# Patient Record
Sex: Female | Born: 1975 | Race: Black or African American | Hispanic: No | Marital: Single | Smoking: Former smoker
Health system: Southern US, Community
[De-identification: ages and names within clinical notes are randomized; demographics above are authoritative.]

## PROBLEM LIST (undated history)

## (undated) DIAGNOSIS — M7580 Other shoulder lesions, unspecified shoulder: Secondary | ICD-10-CM

## (undated) DIAGNOSIS — I10 Essential (primary) hypertension: Secondary | ICD-10-CM

## (undated) HISTORY — PX: ABDOMINAL HYSTERECTOMY: SHX81

## (undated) HISTORY — PX: TUBAL LIGATION: SHX77

---

## 2001-11-25 ENCOUNTER — Emergency Department (HOSPITAL_COMMUNITY): Admission: EM | Admit: 2001-11-25 | Discharge: 2001-11-26 | Payer: Self-pay | Admitting: Emergency Medicine

## 2001-11-26 ENCOUNTER — Encounter: Payer: Self-pay | Admitting: Emergency Medicine

## 2003-05-20 ENCOUNTER — Other Ambulatory Visit: Admission: RE | Admit: 2003-05-20 | Discharge: 2003-05-20 | Payer: Self-pay | Admitting: Obstetrics and Gynecology

## 2007-05-08 DIAGNOSIS — M7580 Other shoulder lesions, unspecified shoulder: Secondary | ICD-10-CM

## 2007-05-08 DIAGNOSIS — M778 Other enthesopathies, not elsewhere classified: Secondary | ICD-10-CM | POA: Insufficient documentation

## 2007-05-08 HISTORY — DX: Other enthesopathies, not elsewhere classified: M77.8

## 2007-05-08 HISTORY — DX: Other shoulder lesions, unspecified shoulder: M75.80

## 2008-02-21 ENCOUNTER — Emergency Department (HOSPITAL_BASED_OUTPATIENT_CLINIC_OR_DEPARTMENT_OTHER): Admission: EM | Admit: 2008-02-21 | Discharge: 2008-02-21 | Payer: Self-pay | Admitting: Emergency Medicine

## 2011-07-28 ENCOUNTER — Emergency Department (HOSPITAL_COMMUNITY): Payer: Managed Care, Other (non HMO)

## 2011-07-28 ENCOUNTER — Encounter (HOSPITAL_COMMUNITY): Payer: Self-pay | Admitting: Emergency Medicine

## 2011-07-28 ENCOUNTER — Emergency Department (HOSPITAL_COMMUNITY)
Admission: EM | Admit: 2011-07-28 | Discharge: 2011-07-28 | Disposition: A | Discharge status: Home / Self Care | Payer: Managed Care, Other (non HMO) | Attending: Emergency Medicine | Admitting: Emergency Medicine

## 2011-07-28 DIAGNOSIS — M5412 Radiculopathy, cervical region: Secondary | ICD-10-CM | POA: Insufficient documentation

## 2011-07-28 DIAGNOSIS — M79603 Pain in arm, unspecified: Secondary | ICD-10-CM

## 2011-07-28 DIAGNOSIS — Z87891 Personal history of nicotine dependence: Secondary | ICD-10-CM | POA: Insufficient documentation

## 2011-07-28 DIAGNOSIS — M25519 Pain in unspecified shoulder: Secondary | ICD-10-CM | POA: Insufficient documentation

## 2011-07-28 DIAGNOSIS — M79609 Pain in unspecified limb: Secondary | ICD-10-CM | POA: Insufficient documentation

## 2011-07-28 HISTORY — DX: Other shoulder lesions, unspecified shoulder: M75.80

## 2011-07-28 MED ORDER — NAPROXEN 500 MG PO TABS: 500.0000 mg | ORAL_TABLET | Freq: Two times a day (BID) | ORAL | Status: AC | PRN

## 2011-07-28 MED ORDER — DIAZEPAM 5 MG PO TABS: 5.0000 mg | ORAL_TABLET | Freq: Three times a day (TID) | ORAL | Status: AC | PRN

## 2011-07-28 MED ORDER — DIAZEPAM 5 MG PO TABS
5.0000 mg | ORAL_TABLET | Freq: Once | ORAL | Status: AC
  Administered 2011-07-28: 5 mg via ORAL
  Filled 2011-07-28: qty 1

## 2011-07-28 MED ORDER — DIAZEPAM 5 MG/ML IJ SOLN: 5.0000 mg | Freq: Once | INTRAMUSCULAR | Status: DC

## 2011-07-28 MED ORDER — HYDROCODONE-ACETAMINOPHEN 5-325 MG PO TABS
1.0000 | ORAL_TABLET | ORAL | Status: AC | PRN
Start: 2011-07-28 — End: 2011-08-07

## 2011-07-28 NOTE — ED Provider Notes (Signed)
Medical screening examination/treatment/procedure(s) were performed by non-physician practitioner and as supervising physician I was immediately available for consultation/collaboration.   Cyndra Numbers, MD 07/28/11 2047

## 2011-07-28 NOTE — ED Provider Notes (Signed)
History     CSN: [id number]  Arrival date & time 07/28/11  1241   First MD Initiated Contact with Patient 07/28/11 1320      Chief Complaint  Patient presents with  . Arm Pain    (Consider location/radiation/quality/duration/timing/severity/associated sxs/prior treatment) HPI Comments: Patient reports she has developed severe pain and tingling in her bilateral arms over the past two weeks.  States that it began when she started knitting - the pain starting in her hands and eventually moved up into her bilateral shoulders.  She is also having uncomfortable tingling in her bilateral hands.  Is unsure about weakness vs pain causing her to not be able to use her hands very well.  Has been using Biofreeze and ibuprofen without relief.  Pain is exacerbated by lying on either side.  Has had similar symptoms in the past only in her shoulders and was diagnosed with bilateral tendonitis which eventually resolved.  States she has also been diagnosed with carpal tunnel before for similar symptoms.  Denies fevers, neck pain, loss of control of bowel or bladder, weakness or numbness of the legs.    The history is provided by the patient.    Past Medical History  Diagnosis Date  . Tendonitis of shoulder 2009    bil shoulders     History reviewed. No pertinent past surgical history.  No family history on file.  History  Substance Use Topics  . Smoking status: Former Games developer  . Smokeless tobacco: Not on file  . Alcohol Use: 1.2 oz/week    2 Cans of beer per week    OB History    Grav Para Term Preterm Abortions TAB SAB Ect Mult Living                  Review of Systems  Constitutional: Negative for fever.  HENT: Negative for sore throat.   Respiratory: Negative for cough and shortness of breath.   Cardiovascular: Negative for chest pain.  Gastrointestinal: Negative for abdominal pain.  Neurological: Negative for syncope and weakness.  All other systems reviewed and are  negative.    Allergies  Review of patient's allergies indicates no known allergies.  Home Medications   Current Outpatient Rx  Name Route Sig Dispense Refill  . VITAMIN D PO Oral Take 1 tablet by mouth daily.    . IBUPROFEN 800 MG PO TABS Oral Take 800 mg by mouth every 6 (six) hours as needed. For pain.    Marland Kitchen BIOFREEZE EX Apply externally Apply 1 application topically as needed. To arm.    . ADULT MULTIVITAMIN W/MINERALS CH Oral Take 1 tablet by mouth 2 (two) times daily.    Marland Kitchen FISH OIL PO Oral Take 1 capsule by mouth daily.      BP 126/85  Pulse 99  Temp(Src) 98.2 F (36.8 C) (Oral)  Resp 20  SpO2 99%  LMP 07/22/2011  Physical Exam  Nursing note and vitals reviewed. Constitutional: She is oriented to person, place, and time. She appears well-developed and well-nourished.  HENT:  Head: Normocephalic and atraumatic.  Neck: Neck supple.  Cardiovascular: Normal rate and regular rhythm.   Pulmonary/Chest: Effort normal and breath sounds normal.  Musculoskeletal: Normal range of motion. She exhibits tenderness. She exhibits no edema.       Cervical back: She exhibits no tenderness.       Thoracic back: She exhibits no tenderness.       Lumbar back: She exhibits no tenderness.  Back:       Upper extremities: distal pulses intact, sensation intact.  Grip strengths normal and equal bilaterally.  Upper arm strength decreased, possibly secondary to pain.    Neurological: She is alert and oriented to person, place, and time.  Psychiatric: She has a normal mood and affect. Her behavior is normal. Judgment and thought content normal.    ED Course  Procedures (including critical care time)  Labs Reviewed - No data to display No results found.  2:17 PM I spoke with Dr Reche Dixon, radiology, regarding imaging for spinal stenosis.  Patient has implanted metal jewelry all over her body and multiple piercings, many of which she says she cannot remove.   Dr Reche Dixon states that while MRI  is the best test, CT may catch major problems.  I have discussed this with the patient who would like to proceed with the CT.    3:45 PM Patient's strength has improved, now 5/5, after pain control.     1. Arm pain   2. Cervical radiculopathy       MDM  Patient with bilateral arm pain with some tingling in her fingers.  I suspect she might have some element of cervical spinal stenosis.  However, patient is unable to undergo MRI due to extensive implanted metal body jewelry.  No weakness on exam once pain was controlled.  CT shows no major abnormalities.  Pt d/c home with pain medication and orthopedic follow up.  Patient verbalizes understanding and agrees with plan.          Dillard Cannon [place], [place] 07/28/11 1546

## 2011-07-28 NOTE — ED Notes (Signed)
Pt. Stated, I'm having arm pain both arms (pointed to both of her arms at the top of shoulder for 2 weeks.

## 2011-07-28 NOTE — Discharge Instructions (Signed)
Please follow up with your orthopedist or the orthopedist listed above.  Return immediately if you develop loss of control of bowel or bladder, or weakness in your arms or legs.  You may return to the ER at any time for worsening condition or any new symptoms that concern you.   Cervical Radiculopathy Cervical radiculopathy happens when a nerve in the neck is pinched or bruised by a slipped (herniated) disk or by arthritic changes in the bones of the cervical spine. This can occur due to an injury or as part of the normal aging process. Pressure on the cervical nerves can cause pain or numbness that runs from your neck all the way down into your arm and fingers. CAUSES  There are many possible causes, including:  Injury.   Muscle tightness in the neck from overuse.   Swollen, painful joints (arthritis).   Breakdown or degeneration in the bones and joints of the spine (spondylosis) due to aging.   Bone spurs that may develop near the cervical nerves.  SYMPTOMS  Symptoms include pain, weakness, or numbness in the affected arm and hand. Pain can be severe or irritating. Symptoms may be worse when extending or turning the neck. DIAGNOSIS  Your caregiver will ask about your symptoms and do a physical exam. He or she may test your strength and reflexes. X-rays, CT scans, and MRI scans may be needed in cases of injury or if the symptoms do not go away after a period of time. Electromyography (EMG) or nerve conduction testing may be done to study how your nerves and muscles are working. TREATMENT  Your caregiver may recommend certain exercises to help relieve your symptoms. Cervical radiculopathy can, and often does, get better with time and treatment. If your problems continue, treatment options may include:  Wearing a soft collar for short periods of time.   Physical therapy to strengthen the neck muscles.   Medicines, such as nonsteroidal anti-inflammatory drugs (NSAIDs), oral corticosteroids,  or spinal injections.   Surgery. Different types of surgery may be done depending on the cause of your problems.  HOME CARE INSTRUCTIONS   Put ice on the affected area.   Put ice in a plastic bag.   Place a towel between your skin and the bag.   Leave the ice on for 15 to 20 minutes, 3 to 4 times a day or as directed by your caregiver.   Use a flat pillow when you sleep.   Only take over-the-counter or prescription medicines for pain, discomfort, or fever as directed by your caregiver.   If physical therapy was prescribed, follow your caregiver's directions.   If a soft collar was prescribed, use it as directed.  SEEK IMMEDIATE MEDICAL CARE IF:   Your pain gets much worse and cannot be controlled with medicines.   You have weakness or numbness in your hand, arm, face, or leg.   You have a high fever or a stiff, rigid neck.   You lose bowel or bladder control (incontinence).   You have trouble with walking, balance, or speaking.  MAKE SURE YOU:   Understand these instructions.   Will watch your condition.   Will get help right away if you are not doing well or get worse.  Document Released: 01/16/2001 Document Revised: 04/12/2011 Document Reviewed: 12/05/2010 [facility] Patient Information 2012 [place], [place].  Pain of Unknown Etiology (Pain Without a Known Cause) You have come to your caregiver because of pain. Pain can occur in any part of the  body. Often there is not a definite cause. If your laboratory (blood or urine) work was normal and x-rays or other studies were normal, your caregiver may treat you without knowing the cause of the pain. An example of this is the headache. Most headaches are diagnosed by taking a history. This means your caregiver asks you questions about your headaches. Your caregiver determines a treatment based on your answers. Usually testing done for headaches is normal. Often testing is not done unless there is no response to medications.  Regardless of where your pain is located today, you can be given medications to make you comfortable. If no physical cause of pain can be found, most cases of pain will gradually leave as suddenly as they came.  If you have a painful condition and no reason can be found for the pain, It is importantthat you follow up with your caregiver. If the pain becomes worse or does not go away, it may be necessary to repeat tests and look further for a possible cause.  Only take over-the-counter or prescription medicines for pain, discomfort, or fever as directed by your caregiver.   For the protection of your privacy, test results can not be given over the phone. Make sure you receive the results of your test. Ask as to how these results are to be obtained if you have not been informed. It is your responsibility to obtain your test results.   You may continue all activities unless the activities cause more pain. When the pain lessens, it is important to gradually resume normal activities. Resume activities by beginning slowly and gradually increasing the intensity and duration of the activities or exercise. During periods of severe pain, bed-rest may be helpful. Lay or sit in any position that is comfortable.   Ice used for acute (sudden) conditions may be effective. Use a large plastic bag filled with ice and wrapped in a towel. This may provide pain relief.   See your caregiver for continued problems. They can help or refer you for exercises or physical therapy if necessary.  If you were given medications for your condition, do not drive, operate machinery or power tools, or sign legal documents for 24 hours. Do not drink alcohol, take sleeping pills, or take other medications that may interfere with treatment. See your caregiver immediately if you have pain that is becoming worse and not relieved by medications. Document Released: 01/16/2001 Document Revised: 04/12/2011 Document Reviewed:  04/23/2005 [facility] Patient Information 2012 [place], [place].

## 2013-04-08 ENCOUNTER — Other Ambulatory Visit: Payer: Self-pay | Admitting: Obstetrics and Gynecology

## 2013-04-17 ENCOUNTER — Encounter (HOSPITAL_COMMUNITY): Payer: Self-pay | Admitting: Pharmacist

## 2013-04-22 ENCOUNTER — Encounter (HOSPITAL_COMMUNITY): Payer: Self-pay

## 2013-04-22 ENCOUNTER — Encounter (HOSPITAL_COMMUNITY)
Admission: RE | Admit: 2013-04-22 | Discharge: 2013-04-22 | Disposition: A | Discharge status: Home / Self Care | Payer: Managed Care, Other (non HMO) | Source: Ambulatory Visit | Attending: Obstetrics and Gynecology | Admitting: Obstetrics and Gynecology

## 2013-04-22 LAB — BASIC METABOLIC PANEL
BUN: 7 mg/dL (ref 6–23)
CO2: 27 mEq/L (ref 19–32)
Calcium: 9.4 mg/dL (ref 8.4–10.5)
Chloride: 98 mEq/L (ref 96–112)
Creatinine, Ser: 0.94 mg/dL (ref 0.50–1.10)
GFR calc Af Amer: 87 mL/min — ABNORMAL LOW (ref >90)
GFR calc non Af Amer: 75 mL/min — ABNORMAL LOW (ref >90)
Glucose, Bld: 83 mg/dL (ref 70–99)
Potassium: 3.8 mEq/L (ref 3.5–5.1)
Sodium: 137 mEq/L (ref 135–145)

## 2013-04-22 LAB — CBC
HCT: 37.9 % (ref 36.0–46.0)
Hemoglobin: 13.5 g/dL (ref 12.0–15.0)
MCH: 33.4 pg (ref 26.0–34.0)
MCHC: 35.6 g/dL (ref 30.0–36.0)
MCV: 93.8 fL (ref 78.0–100.0)
Platelets: 275 10*3/uL (ref 150–400)
RBC: 4.04 MIL/uL (ref 3.87–5.11)
RDW: 13 % (ref 11.5–15.5)
WBC: 4 10*3/uL (ref 4.0–10.5)

## 2013-04-22 NOTE — Anesthesia Preprocedure Evaluation (Addendum)
Anesthesia Evaluation  Patient identified by MRN, date of birth, ID band Patient awake    Reviewed: Allergy & Precautions, H&P , Patient's Chart, lab work & pertinent test results  Airway Mallampati: II      Dental no notable dental hx. (+) Teeth Intact   Pulmonary neg pulmonary ROS, Current Smoker, former smoker,  breath sounds clear to auscultation  Pulmonary exam normal       Cardiovascular negative cardio ROS  Rhythm:Regular Rate:Normal     Neuro/Psych Anxiety  Neuromuscular disease    GI/Hepatic negative GI ROS, Neg liver ROS,   Endo/Other  negative endocrine ROS  Renal/GU negative Renal ROS  negative genitourinary   Musculoskeletal negative musculoskeletal ROS (+)   Abdominal (+) - obese,   Peds  Hematology negative hematology ROS (+)   Anesthesia Other Findings   Reproductive/Obstetrics Menorrhagia Fibroid uterus                         Anesthesia Physical Anesthesia Plan  ASA: II  Anesthesia Plan: General   Post-op Pain Management:    Induction: Intravenous  Airway Management Planned: Oral ETT  Additional Equipment:   Intra-op Plan:   Post-operative Plan: Extubation in OR  Informed Consent: I have reviewed the patients History and Physical, chart, labs and discussed the procedure including the risks, benefits and alternatives for the proposed anesthesia with the patient or authorized representative who has indicated his/her understanding and acceptance.   Dental advisory given  Plan Discussed with: CRNA, Anesthesiologist and Surgeon  Anesthesia Plan Comments:         Anesthesia Quick Evaluation

## 2013-04-22 NOTE — Patient Instructions (Signed)
Your procedure is scheduled on: 04/23/2013  Enter through the Main Entrance of [facility] at: 11:00AM  Pick up the phone at the desk and dial [phone].  Call this number if you have problems the morning of surgery: [phone].  Remember: Do NOT eat food: AFTER MIDNIGHT TONIGHT Do NOT drink clear liquids after: AFTER 8:30AM TOMORROW Take these medicines the morning of surgery with a SIP OF WATER: NONE  Do NOT wear jewelry (body piercing), make-up, or nail polish. Do NOT wear lotions, powders, or perfumes.  You may wear deoderant. Do NOT shave for 48 hours prior to surgery. Do NOT bring valuables to the hospital. Contacts, dentures, or bridgework may not be worn into surgery. Leave suitcase in car.  After surgery it may be brought to your room.  For patients admitted to the hospital, checkout time is 11:00 AM the day of discharge.

## 2013-04-23 ENCOUNTER — Ambulatory Visit (HOSPITAL_COMMUNITY): Payer: Managed Care, Other (non HMO) | Admitting: Anesthesiology

## 2013-04-23 ENCOUNTER — Encounter (HOSPITAL_COMMUNITY): Payer: Self-pay | Admitting: *Deleted

## 2013-04-23 ENCOUNTER — Ambulatory Visit (HOSPITAL_COMMUNITY)
Admission: RE | Admit: 2013-04-23 | Discharge: 2013-04-24 | Disposition: A | Discharge status: Home / Self Care | Payer: Managed Care, Other (non HMO) | Source: Ambulatory Visit | Attending: Obstetrics and Gynecology | Admitting: Obstetrics and Gynecology

## 2013-04-23 ENCOUNTER — Encounter (HOSPITAL_COMMUNITY): Payer: Managed Care, Other (non HMO) | Admitting: Anesthesiology

## 2013-04-23 ENCOUNTER — Encounter (HOSPITAL_COMMUNITY)
Admission: RE | Disposition: A | Discharge status: Home / Self Care | Payer: Self-pay | Source: Ambulatory Visit | Attending: Obstetrics and Gynecology

## 2013-04-23 DIAGNOSIS — D252 Subserosal leiomyoma of uterus: Secondary | ICD-10-CM | POA: Insufficient documentation

## 2013-04-23 DIAGNOSIS — N946 Dysmenorrhea, unspecified: Secondary | ICD-10-CM | POA: Insufficient documentation

## 2013-04-23 DIAGNOSIS — Z90710 Acquired absence of both cervix and uterus: Secondary | ICD-10-CM

## 2013-04-23 DIAGNOSIS — D251 Intramural leiomyoma of uterus: Secondary | ICD-10-CM | POA: Insufficient documentation

## 2013-04-23 DIAGNOSIS — N838 Other noninflammatory disorders of ovary, fallopian tube and broad ligament: Secondary | ICD-10-CM | POA: Insufficient documentation

## 2013-04-23 DIAGNOSIS — N920 Excessive and frequent menstruation with regular cycle: Principal | ICD-10-CM | POA: Insufficient documentation

## 2013-04-23 HISTORY — PX: BILATERAL SALPINGECTOMY: SHX5743

## 2013-04-23 SURGERY — ROBOTIC SINGLE SITE TOTAL HYSTERECTOMY
Anesthesia: General

## 2013-04-23 MED ORDER — DEXTROSE IN LACTATED RINGERS 5 % IV SOLN
INTRAVENOUS | Status: DC
  Administered 2013-04-23: 1000 mL via INTRAVENOUS

## 2013-04-23 MED ORDER — SODIUM CHLORIDE 0.9 % IJ SOLN
INTRAMUSCULAR | Status: AC
  Filled 2013-04-23: qty 10

## 2013-04-23 MED ORDER — SCOPOLAMINE 1 MG/3DAYS TD PT72
MEDICATED_PATCH | TRANSDERMAL | Status: AC
  Administered 2013-04-23: 1.5 mg
  Filled 2013-04-23: qty 1

## 2013-04-23 MED ORDER — HYDROMORPHONE HCL PF 1 MG/ML IJ SOLN
0.2500 mg | INTRAMUSCULAR | Status: DC | PRN
  Administered 2013-04-23 (×4): 0.5 mg via INTRAVENOUS

## 2013-04-23 MED ORDER — LIDOCAINE HCL (CARDIAC) 20 MG/ML IV SOLN
INTRAVENOUS | Status: DC | PRN
  Administered 2013-04-23: 50 mg via INTRAVENOUS

## 2013-04-23 MED ORDER — GLYCOPYRROLATE 0.2 MG/ML IJ SOLN
INTRAMUSCULAR | Status: DC | PRN
  Administered 2013-04-23: .8 mg via INTRAVENOUS

## 2013-04-23 MED ORDER — PNEUMOCOCCAL VAC POLYVALENT 25 MCG/0.5ML IJ INJ
0.5000 mL | INJECTION | INTRAMUSCULAR | Status: AC
  Administered 2013-04-24: 0.5 mL via INTRAMUSCULAR
  Filled 2013-04-23: qty 0.5

## 2013-04-23 MED ORDER — HYDROMORPHONE HCL PF 1 MG/ML IJ SOLN: 0.2000 mg | INTRAMUSCULAR | Status: DC | PRN

## 2013-04-23 MED ORDER — KETOROLAC TROMETHAMINE 30 MG/ML IJ SOLN: 30.0000 mg | Freq: Four times a day (QID) | INTRAMUSCULAR | Status: DC

## 2013-04-23 MED ORDER — LABETALOL HCL 5 MG/ML IV SOLN
5.0000 mg | Freq: Once | INTRAVENOUS | Status: DC
  Filled 2013-04-23: qty 4

## 2013-04-23 MED ORDER — ONDANSETRON HCL 4 MG PO TABS: 4.0000 mg | ORAL_TABLET | Freq: Four times a day (QID) | ORAL | Status: DC | PRN

## 2013-04-23 MED ORDER — ARTIFICIAL TEARS OP OINT
TOPICAL_OINTMENT | OPHTHALMIC | Status: DC | PRN
  Administered 2013-04-23: 1 via OPHTHALMIC

## 2013-04-23 MED ORDER — KETOROLAC TROMETHAMINE 30 MG/ML IJ SOLN
30.0000 mg | Freq: Four times a day (QID) | INTRAMUSCULAR | Status: DC
  Administered 2013-04-23: 30 mg via INTRAVENOUS
  Filled 2013-04-23: qty 1

## 2013-04-23 MED ORDER — IBUPROFEN 800 MG PO TABS
800.0000 mg | ORAL_TABLET | Freq: Three times a day (TID) | ORAL | Status: DC | PRN
  Administered 2013-04-24: 800 mg via ORAL
  Filled 2013-04-23: qty 1

## 2013-04-23 MED ORDER — MIDAZOLAM HCL 5 MG/5ML IJ SOLN
INTRAMUSCULAR | Status: DC | PRN
  Administered 2013-04-23: 2 mg via INTRAVENOUS

## 2013-04-23 MED ORDER — HYDROMORPHONE HCL PF 1 MG/ML IJ SOLN
INTRAMUSCULAR | Status: AC
  Filled 2013-04-23: qty 1

## 2013-04-23 MED ORDER — DEXAMETHASONE SODIUM PHOSPHATE 10 MG/ML IJ SOLN
INTRAMUSCULAR | Status: AC
  Filled 2013-04-23: qty 1

## 2013-04-23 MED ORDER — ONDANSETRON HCL 4 MG/2ML IJ SOLN
INTRAMUSCULAR | Status: AC
  Filled 2013-04-23: qty 2

## 2013-04-23 MED ORDER — ACETAMINOPHEN 160 MG/5ML PO SOLN
650.0000 mg | ORAL | Status: DC
  Administered 2013-04-24 (×3): 650 mg via ORAL
  Filled 2013-04-23 (×3): qty 20.3

## 2013-04-23 MED ORDER — LIDOCAINE HCL (CARDIAC) 20 MG/ML IV SOLN
INTRAVENOUS | Status: AC
  Filled 2013-04-23: qty 5

## 2013-04-23 MED ORDER — ZOLPIDEM TARTRATE 5 MG PO TABS: 5.0000 mg | ORAL_TABLET | Freq: Every evening | ORAL | Status: DC | PRN

## 2013-04-23 MED ORDER — ROCURONIUM BROMIDE 100 MG/10ML IV SOLN
INTRAVENOUS | Status: DC | PRN
  Administered 2013-04-23: 20 mg via INTRAVENOUS
  Administered 2013-04-23: 10 mg via INTRAVENOUS
  Administered 2013-04-23: 60 mg via INTRAVENOUS
  Administered 2013-04-23: 10 mg via INTRAVENOUS

## 2013-04-23 MED ORDER — PANTOPRAZOLE SODIUM 40 MG PO TBEC
40.0000 mg | DELAYED_RELEASE_TABLET | Freq: Every day | ORAL | Status: DC
  Administered 2013-04-24: 40 mg via ORAL
  Filled 2013-04-23: qty 1

## 2013-04-23 MED ORDER — ARTIFICIAL TEARS OP OINT
TOPICAL_OINTMENT | OPHTHALMIC | Status: AC
  Filled 2013-04-23: qty 3.5

## 2013-04-23 MED ORDER — KETOROLAC TROMETHAMINE 30 MG/ML IJ SOLN
INTRAMUSCULAR | Status: DC | PRN
  Administered 2013-04-23: 30 mg via INTRAMUSCULAR
  Administered 2013-04-23: 30 mg via INTRAVENOUS

## 2013-04-23 MED ORDER — LACTATED RINGERS IV SOLN
INTRAVENOUS | Status: DC
  Administered 2013-04-23 (×2): via INTRAVENOUS

## 2013-04-23 MED ORDER — FENTANYL CITRATE 0.05 MG/ML IJ SOLN
INTRAMUSCULAR | Status: AC
  Filled 2013-04-23: qty 5

## 2013-04-23 MED ORDER — HYDROMORPHONE HCL PF 1 MG/ML IJ SOLN
INTRAMUSCULAR | Status: DC | PRN
  Administered 2013-04-23: 0.5 mg via INTRAVENOUS
  Administered 2013-04-23: .5 mg via INTRAVENOUS

## 2013-04-23 MED ORDER — INFLUENZA VAC SPLIT QUAD 0.5 ML IM SUSP
0.5000 mL | INTRAMUSCULAR | Status: AC
  Administered 2013-04-24: 0.5 mL via INTRAMUSCULAR
  Filled 2013-04-23: qty 0.5

## 2013-04-23 MED ORDER — PROPOFOL 10 MG/ML IV EMUL
INTRAVENOUS | Status: AC
  Filled 2013-04-23: qty 20

## 2013-04-23 MED ORDER — MIDAZOLAM HCL 2 MG/2ML IJ SOLN
INTRAMUSCULAR | Status: AC
  Filled 2013-04-23: qty 2

## 2013-04-23 MED ORDER — LACTATED RINGERS IR SOLN
Status: DC | PRN
  Administered 2013-04-23: 3000 mL

## 2013-04-23 MED ORDER — FENTANYL CITRATE 0.05 MG/ML IJ SOLN
100.0000 ug | Freq: Once | INTRAMUSCULAR | Status: AC
  Administered 2013-04-23 (×2): 50 ug via INTRAVENOUS

## 2013-04-23 MED ORDER — ONDANSETRON HCL 4 MG/2ML IJ SOLN
INTRAMUSCULAR | Status: DC | PRN
  Administered 2013-04-23: 4 mg via INTRAVENOUS

## 2013-04-23 MED ORDER — CEFAZOLIN SODIUM-DEXTROSE 2-3 GM-% IV SOLR
2.0000 g | INTRAVENOUS | Status: AC
  Administered 2013-04-23: 2 g via INTRAVENOUS

## 2013-04-23 MED ORDER — BUPIVACAINE HCL (PF) 0.25 % IJ SOLN
INTRAMUSCULAR | Status: AC
  Filled 2013-04-23: qty 30

## 2013-04-23 MED ORDER — FENTANYL CITRATE 0.05 MG/ML IJ SOLN
INTRAMUSCULAR | Status: DC | PRN
  Administered 2013-04-23: 150 ug via INTRAVENOUS
  Administered 2013-04-23: 100 ug via INTRAVENOUS
  Administered 2013-04-23: 50 ug via INTRAVENOUS
  Administered 2013-04-23: 100 ug via INTRAVENOUS
  Administered 2013-04-23 (×2): 50 ug via INTRAVENOUS

## 2013-04-23 MED ORDER — NEOSTIGMINE METHYLSULFATE 1 MG/ML IJ SOLN
INTRAMUSCULAR | Status: DC | PRN
  Administered 2013-04-23: 4 mg via INTRAVENOUS

## 2013-04-23 MED ORDER — ROCURONIUM BROMIDE 100 MG/10ML IV SOLN
INTRAVENOUS | Status: AC
  Filled 2013-04-23: qty 1

## 2013-04-23 MED ORDER — ONDANSETRON HCL 4 MG/2ML IJ SOLN: 4.0000 mg | Freq: Four times a day (QID) | INTRAMUSCULAR | Status: DC | PRN

## 2013-04-23 MED ORDER — BUPIVACAINE HCL (PF) 0.25 % IJ SOLN
INTRAMUSCULAR | Status: DC | PRN
  Administered 2013-04-23: 2 mL

## 2013-04-23 MED ORDER — FENTANYL CITRATE 0.05 MG/ML IJ SOLN
INTRAMUSCULAR | Status: AC
  Administered 2013-04-23: 50 ug via INTRAVENOUS
  Filled 2013-04-23: qty 2

## 2013-04-23 MED ORDER — OXYCODONE-ACETAMINOPHEN 5-325 MG PO TABS
1.0000 | ORAL_TABLET | ORAL | Status: DC | PRN
  Administered 2013-04-23 – 2013-04-24 (×2): 2 via ORAL
  Filled 2013-04-23 (×2): qty 2

## 2013-04-23 MED ORDER — MENTHOL 3 MG MT LOZG: 1.0000 | LOZENGE | OROMUCOSAL | Status: DC | PRN

## 2013-04-23 MED ORDER — DEXAMETHASONE SODIUM PHOSPHATE 10 MG/ML IJ SOLN
INTRAMUSCULAR | Status: DC | PRN
  Administered 2013-04-23: 10 mg via INTRAVENOUS

## 2013-04-23 MED ORDER — PROPOFOL 10 MG/ML IV BOLUS
INTRAVENOUS | Status: DC | PRN
  Administered 2013-04-23: 200 mg via INTRAVENOUS

## 2013-04-23 SURGICAL SUPPLY — 78 items
ADH SKN CLS APL DERMABOND .7 (GAUZE/BANDAGES/DRESSINGS) ×2
BAG URINE DRAINAGE (UROLOGICAL SUPPLIES) ×3 IMPLANT
BARRIER ADHS 3X4 INTERCEED (GAUZE/BANDAGES/DRESSINGS) ×3 IMPLANT
BRR ADH 4X3 ABS CNTRL BYND (GAUZE/BANDAGES/DRESSINGS) ×2
CANNULA SEALS 8.5MM (CANNULA) ×2
CATH FOLEY 3WAY  5CC 16FR (CATHETERS) ×1
CATH FOLEY 3WAY 5CC 16FR (CATHETERS) ×2 IMPLANT
CHLORAPREP W/TINT 26ML (MISCELLANEOUS) ×3 IMPLANT
CLOTH BEACON ORANGE TIMEOUT ST (SAFETY) ×3 IMPLANT
CONT PATH 16OZ SNAP LID 3702 (MISCELLANEOUS) ×3 IMPLANT
CORD ACTIVE DISPOSABLE (ELECTRODE) ×1
CORD ELECTRO ACTIVE DISP (ELECTRODE) IMPLANT
CORDS BIPOLAR (ELECTRODE) ×1 IMPLANT
COVER MAYO STAND STRL (DRAPES) ×3 IMPLANT
COVER TABLE BACK 60X90 (DRAPES) ×6 IMPLANT
COVER TIP SHEARS 8 DVNC (MISCELLANEOUS) ×2 IMPLANT
COVER TIP SHEARS 8MM DA VINCI (MISCELLANEOUS) ×1
DECANTER SPIKE VIAL GLASS SM (MISCELLANEOUS) ×3 IMPLANT
DERMABOND ADVANCED (GAUZE/BANDAGES/DRESSINGS) ×1
DERMABOND ADVANCED .7 DNX12 (GAUZE/BANDAGES/DRESSINGS) ×2 IMPLANT
DEVICE TROCAR PUNCTURE CLOSURE (ENDOMECHANICALS) IMPLANT
DRAPE HUG U DISPOSABLE (DRAPE) ×3 IMPLANT
DRAPE LG THREE QUARTER DISP (DRAPES) ×6 IMPLANT
DRAPE WARM FLUID 44X44 (DRAPE) ×3 IMPLANT
ELECT REM PT RETURN 9FT ADLT (ELECTROSURGICAL) ×3
ELECTRODE REM PT RTRN 9FT ADLT (ELECTROSURGICAL) ×2 IMPLANT
EVACUATOR SMOKE 8.L (FILTER) ×3 IMPLANT
FILTER SMOKE EVAC LAPAROSHD (FILTER) ×1 IMPLANT
FORCEPS BI-POLAR FEN SS 5MM (FORCEP) ×1 IMPLANT
GAUZE VASELINE 3X9 (GAUZE/BANDAGES/DRESSINGS) IMPLANT
GLOVE BIOGEL PI IND STRL 7.0 (GLOVE) ×4 IMPLANT
GLOVE BIOGEL PI INDICATOR 7.0 (GLOVE) ×2
GLOVE ECLIPSE 6.5 STRL STRAW (GLOVE) ×3 IMPLANT
GOWN STRL REIN XL XLG (GOWN DISPOSABLE) ×18 IMPLANT
IRRIGATOR SUCTION SS 5MM (IRRIGATION / IRRIGATOR) ×2 IMPLANT
KIT ACCESSORY DA VINCI DISP (KITS) ×1
KIT ACCESSORY DVNC DISP (KITS) ×2 IMPLANT
LEGGING LITHOTOMY PAIR STRL (DRAPES) ×3 IMPLANT
NDL INSUFFLATION 14GA 150MM (NEEDLE) ×2 IMPLANT
NEEDLE INSUFFLATION 14GA 150MM (NEEDLE) ×3 IMPLANT
OCCLUDER COLPOPNEUMO (BALLOONS) ×2 IMPLANT
PACK LAVH (CUSTOM PROCEDURE TRAY) ×3 IMPLANT
PAD PREP 24X48 CUFFED NSTRL (MISCELLANEOUS) ×6 IMPLANT
PLUG CATH AND CAP STER (CATHETERS) ×3 IMPLANT
PORT ENDOSCOPE SS 8.5MM (MISCELLANEOUS) IMPLANT
PROTECTOR NERVE ULNAR (MISCELLANEOUS) ×6 IMPLANT
RETRACTOR WOUND ALXS 19CM XSML (INSTRUMENTS) IMPLANT
RTRCTR WOUND ALEXIS 19CM XSML (INSTRUMENTS)
SCISSORS LAP 5X35 DISP (ENDOMECHANICALS) IMPLANT
SEAL CANN 8.5 DVNC (CANNULA) IMPLANT
SET CYSTO W/LG BORE CLAMP LF (SET/KITS/TRAYS/PACK) IMPLANT
SET IRRIG TUBING LAPAROSCOPIC (IRRIGATION / IRRIGATOR) ×3 IMPLANT
SOLUTION ELECTROLUBE (MISCELLANEOUS) ×3 IMPLANT
SUT MNCRL AB 4-0 PS2 18 (SUTURE) ×1 IMPLANT
SUT MNCRL+ AB 3-0 CT1 36 (SUTURE) IMPLANT
SUT MONOCRYL AB 3-0 CT1 36IN (SUTURE) ×1
SUT PDS AB 0 CTX 36 PDP370T (SUTURE) ×1 IMPLANT
SUT VICRYL 0 UR6 27IN ABS (SUTURE) ×5 IMPLANT
SUT VLOC 180 0 6IN GS21 (SUTURE) ×2 IMPLANT
SUT VLOC 180 0 9IN  GS21 (SUTURE) ×1
SUT VLOC 180 0 9IN GS21 (SUTURE) ×2 IMPLANT
SYR 50ML LL SCALE MARK (SYRINGE) ×3 IMPLANT
SYRINGE 10CC LL (SYRINGE) ×3 IMPLANT
TIP RUMI ORANGE 6.7MMX12CM (TIP) IMPLANT
TIP UTERINE 5.1X6CM LAV DISP (MISCELLANEOUS) IMPLANT
TIP UTERINE 6.7X10CM GRN DISP (MISCELLANEOUS) IMPLANT
TIP UTERINE 6.7X6CM WHT DISP (MISCELLANEOUS) IMPLANT
TIP UTERINE 6.7X8CM BLUE DISP (MISCELLANEOUS) ×1 IMPLANT
TOWEL OR 17X24 6PK STRL BLUE (TOWEL DISPOSABLE) ×9 IMPLANT
TROCAR 12M 150ML BLUNT (TROCAR) IMPLANT
TROCAR DISP BLADELESS 8 DVNC (TROCAR) IMPLANT
TROCAR DISP BLADELESS 8MM (TROCAR)
TROCAR XCEL 12X100 BLDLESS (ENDOMECHANICALS) ×3 IMPLANT
TROCAR XCEL NON-BLD 5MMX100MML (ENDOMECHANICALS) ×3 IMPLANT
TROCAR Z-THREAD 12X150 (TROCAR) ×3 IMPLANT
TUBING FILTER THERMOFLATOR (ELECTROSURGICAL) ×3 IMPLANT
WARMER LAPAROSCOPE (MISCELLANEOUS) ×3 IMPLANT
WATER STERILE IRR 1000ML POUR (IV SOLUTION) ×9 IMPLANT

## 2013-04-23 NOTE — Brief Op Note (Signed)
04/23/2013  6:13 PM  PATIENT:  Talitha Givens  37 y.o. female  PRE-OPERATIVE DIAGNOSIS:  Menorrhagia, Uterine Fibroid, Dysmenorrhea    POST-OPERATIVE DIAGNOSIS:  Menorrhagia, Uterine Fibroid, Dysmenorrhea    PROCEDURE:  Procedure(s) with comments: ROBOTIC SINGLE SITE TOTAL HYSTERECTOMY (N/A) - 3 1/2 hrs. BILATERAL SALPINGECTOMY (Bilateral)  SURGEON:  Surgeon(s) and Role:    * Jessye Imhoff Cathie Beams, MD - Primary  PHYSICIAN ASSISTANT:   ASSISTANTS: Marlinda Mike, CNM   ANESTHESIA:   general Findings: nl appendix, nl liver edge, fibroid uterus, nl ovaries  EBL:  Total I/O In: 2000 [I.V.:2000] Out: 220 [Urine:120; Blood:100]  BLOOD ADMINISTERED:none  DRAINS: none   LOCAL MEDICATIONS USED:  MARCAINE     SPECIMEN:  Source of Specimen:  uterus w/ cervix, tubes  DISPOSITION OF SPECIMEN:  PATHOLOGY  COUNTS:  YES  TOURNIQUET:  * No tourniquets in log *  DICTATION: .Other Dictation: Dictation Number [id number]  PLAN OF CARE: Admit for overnight observation  PATIENT DISPOSITION:  PACU - hemodynamically stable.   Delay start of Pharmacological VTE agent (>24hrs) due to surgical blood loss or risk of bleeding: no

## 2013-04-23 NOTE — Preoperative (Signed)
Beta Blockers   Reason not to administer Beta Blockers:Not Applicable 

## 2013-04-23 NOTE — Anesthesia Postprocedure Evaluation (Signed)
  Anesthesia Post-op Note  Patient: Mallory Fernandez  Procedure(s) Performed: Procedure(s) with comments: ROBOTIC SINGLE SITE TOTAL HYSTERECTOMY (N/A) - 3 1/2 hrs. BILATERAL SALPINGECTOMY (Bilateral) Patient is awake and responsive. Pain and nausea are reasonably well controlled. Vital signs are stable and clinically acceptable. Oxygen saturation is clinically acceptable. There are no apparent anesthetic complications at this time. Patient is ready for discharge.

## 2013-04-23 NOTE — Transfer of Care (Signed)
Immediate Anesthesia Transfer of Care Note  Patient: Mallory Fernandez  Procedure(s) Performed: Procedure(s) with comments: ROBOTIC SINGLE SITE TOTAL HYSTERECTOMY (N/A) - 3 1/2 hrs. BILATERAL SALPINGECTOMY (Bilateral)  Patient Location: PACU  Anesthesia Type:General  Level of Consciousness: awake, alert  and oriented  Airway & Oxygen Therapy: Patient Spontanous Breathing and Patient connected to nasal cannula oxygen  Post-op Assessment: Report given to PACU RN and Post -op Vital signs reviewed and stable  Post vital signs: Reviewed and stable  Complications: No apparent anesthesia complications

## 2013-04-24 ENCOUNTER — Encounter (HOSPITAL_COMMUNITY): Payer: Self-pay | Admitting: Obstetrics and Gynecology

## 2013-04-24 LAB — CBC
HCT: 36.7 % (ref 36.0–46.0)
Hemoglobin: 12.9 g/dL (ref 12.0–15.0)
MCH: 33.9 pg (ref 26.0–34.0)
MCHC: 35.1 g/dL (ref 30.0–36.0)
MCV: 96.3 fL (ref 78.0–100.0)
Platelets: 246 10*3/uL (ref 150–400)
RBC: 3.81 MIL/uL — ABNORMAL LOW (ref 3.87–5.11)
RDW: 13.3 % (ref 11.5–15.5)
WBC: 15.4 10*3/uL — ABNORMAL HIGH (ref 4.0–10.5)

## 2013-04-24 LAB — BASIC METABOLIC PANEL
BUN: 7 mg/dL (ref 6–23)
CO2: 30 mEq/L (ref 19–32)
Calcium: 8.9 mg/dL (ref 8.4–10.5)
Chloride: 100 mEq/L (ref 96–112)
Creatinine, Ser: 0.94 mg/dL (ref 0.50–1.10)
GFR calc Af Amer: 87 mL/min — ABNORMAL LOW (ref >90)
GFR calc non Af Amer: 75 mL/min — ABNORMAL LOW (ref >90)
Glucose, Bld: 108 mg/dL — ABNORMAL HIGH (ref 70–99)
Potassium: 4.2 mEq/L (ref 3.5–5.1)
Sodium: 136 mEq/L (ref 135–145)

## 2013-04-24 MED ORDER — IBUPROFEN 800 MG PO TABS: 800.0000 mg | ORAL_TABLET | Freq: Three times a day (TID) | ORAL | Status: DC | PRN

## 2013-04-24 MED ORDER — OXYCODONE-ACETAMINOPHEN 5-325 MG PO TABS: 1.0000 | ORAL_TABLET | ORAL | Status: DC | PRN

## 2013-04-24 NOTE — Anesthesia Postprocedure Evaluation (Signed)
Anesthesia Post Note  Patient: Mallory Fernandez  Procedure(s) Performed: Procedure(s) (LRB): ROBOTIC SINGLE SITE TOTAL HYSTERECTOMY (N/A) BILATERAL SALPINGECTOMY (Bilateral)  Anesthesia type: General  Patient location: Mother/Baby  Post pain: Pain level controlled  Post assessment: Post-op Vital signs reviewed  Last Vitals:  Filed Vitals:   04/24/13 0505  BP: 120/80  Pulse: 75  Temp: 36.5 C  Resp: 18    Post vital signs: Reviewed  Level of consciousness: awake and alert   Complications: No apparent anesthesia complications

## 2013-04-24 NOTE — Progress Notes (Signed)
Subjective: Patient reports tolerating PO, + flatus and no problems voiding.    Objective: I have reviewed patient's vital signs.  vital signs, intake and output and labs. Filed Vitals:   04/24/13 0505  BP: 120/80  Pulse: 75  Temp: 97.7 F (36.5 C)  Resp: 18   I/O last 3 completed shifts: In: 5705.2 [P.O.:2160; I.V.:3545.2] Out: 895 [Urine:795; Blood:100]    Lab Results  Component Value Date   WBC 15.4* 04/24/2013   HGB 12.9 04/24/2013   HCT 36.7 04/24/2013   MCV 96.3 04/24/2013   PLT 246 04/24/2013   Lab Results  Component Value Date   CREATININE 0.94 04/24/2013    EXAM General: alert, cooperative and no distress Resp: clear to auscultation bilaterally Cardio: regular rate and rhythm, S1, S2 normal, no murmur, click, rub or gallop GI: soft, non-tender; bowel sounds normal; no masses,  no organomegaly and incision: clean, dry and intact Extremities: no edema, redness or tenderness in the calves or thighs Vaginal Bleeding: none  Assessment: s/p Procedure(s):DaVinci ROBOTIC SINGLE SITE TOTAL HYSTERECTOMY BILATERAL SALPINGECTOMY: stable, progressing well and tolerating diet  Plan: Advance diet Discontinue IV fluids Discharge home D/c instructions reviewed  LOS: 1 day    Lyrick Worland A, MD 04/24/2013 8:14 AM    04/24/2013, 8:14 AM

## 2013-04-24 NOTE — Discharge Summary (Signed)
Physician Discharge Summary  Patient ID: Mallory Fernandez MRN: [id number] DOB/AGE: 1975-11-09 37 y.o.  Admit date: 04/23/2013 Discharge date: 04/24/2013  Admission Diagnoses: menorrhagia, dysmenorrhea, uterine fibroids  Discharge Diagnoses: menorrhagia, dysmenorrhea, uterine fibroids  Active Problems:   S/P hysterectomy   Discharged Condition: stable  Hospital Course: pt was admitted to [facility]. She underwent robotic single site hysterectomy, bilateral salpingectomy. Uncomplicated postop course  Consults: None  Significant Diagnostic Studies: labs:  CBC    Component Value Date/Time   WBC 15.4* 04/24/2013 0527   RBC 3.81* 04/24/2013 0527   HGB 12.9 04/24/2013 0527   HCT 36.7 04/24/2013 0527   PLT 246 04/24/2013 0527   MCV 96.3 04/24/2013 0527   MCH 33.9 04/24/2013 0527   MCHC 35.1 04/24/2013 0527   RDW 13.3 04/24/2013 0527    BMET    Component Value Date/Time   NA 136 04/24/2013 0527   K 4.2 04/24/2013 0527   CL 100 04/24/2013 0527   CO2 30 04/24/2013 0527   GLUCOSE 108* 04/24/2013 0527   BUN 7 04/24/2013 0527   CREATININE 0.94 04/24/2013 0527   CALCIUM 8.9 04/24/2013 0527   GFRNONAA 75* 04/24/2013 0527   GFRAA 87* 04/24/2013 0527     Treatments: surgery: davinci robotic single site total hysterectomy, bilateral salpingectomy  Discharge Exam: Blood pressure 120/80, pulse 75, temperature 97.7 F (36.5 C), temperature source Oral, resp. rate 18, height 5\' 8"  (1.727 m), weight 67.586 kg (149 lb), SpO2 100.00%. General appearance: alert, cooperative and no distress Back: no tenderness to percussion or palpation, symmetric, no curvature. ROM normal. No CVA tenderness. Cardio: regular rate and rhythm, S1, S2 normal, no murmur, click, rub or gallop GI: soft, non-tender; bowel sounds normal; no masses,  no organomegaly Pelvic: deferred Extremities: no edema, redness or tenderness in the calves or thighs Incision/Wound:intact. Dressing clean/dry  Disposition:  01-Home or Self Care  Discharge Orders   Future Orders Complete By Expires   Diet - low sodium heart healthy  As directed    Discharge patient  As directed    Increase activity slowly  As directed    May walk up steps  As directed    No wound care  As directed        Medication List         BC HEADACHE POWDER PO  Take 1 packet by mouth as needed (headache).     BIOFREEZE EX  Apply 1 application topically as needed. To arm.     Fish Oil 1000 MG Caps  Take 1 capsule by mouth daily.     ibuprofen 800 MG tablet  Commonly known as:  ADVIL,MOTRIN  Take 1 tablet (800 mg total) by mouth every 8 (eight) hours as needed (mild pain).     multivitamin with minerals Tabs tablet  Take 1 tablet by mouth 2 (two) times daily. Mega Woman Multivitamin from [facility]     oxyCODONE-acetaminophen 5-325 MG per tablet  Commonly known as:  PERCOCET/ROXICET  Take 1-2 tablets by mouth every 4 (four) hours as needed for severe pain (moderate to severe pain (when tolerating fluids)).           Follow-up Information   Follow up with Serita Kyle, MD.   Specialty:  Obstetrics and Gynecology   Contact information:   [address] [place] [address] [phone]       Signed: Shawnia Vizcarrondo A 04/24/2013, 8:16 AM

## 2013-04-24 NOTE — Progress Notes (Signed)
Discharge instruction provided to patient at bedside.  Follow up appointment, medications, activity, incision care, when to call the doctor and community resources discussed.  No questions at this time.  Patient left unit in stable condition with all personal belongings and prescriptions accompanied by staff.  Osvaldo Angst, RN-----------------

## 2013-04-25 NOTE — Op Note (Signed)
[name], [name] NO.:  [id number]  MEDICAL RECORD NO.:  [id number]  LOCATION:  9310                          FACILITY:  [facility]  PHYSICIAN:  Mallory Fernandez, M.D.DATE OF BIRTH:  12-13-1975  DATE OF PROCEDURE:  04/23/2013 DATE OF DISCHARGE:  04/24/2013                              OPERATIVE REPORT   PREOPERATIVE DIAGNOSIS:  Menorrhagia, uterine fibroid, dysmenorrhea.  PROCEDURE:  Da Vinci robotic single site total hysterectomy, bilateral salpingectomy.  POSTOPERATIVE DIAGNOSIS:  Menorrhagia, uterine fibroid, dysmenorrhea.  ANESTHESIA:  General.  SURGEON:  Mallory Better, MD.  ASSISTANT:  Mallory Fernandez, CNM.  DESCRIPTION OF PROCEDURE:  Under adequate general anesthesia, the patient was placed in the dorsal lithotomy position.  She was positioned for robotic surgery.  She was sterilely prepped and draped in usual fashion.  A 3-way indwelling Foley catheter was sterilely placed. Examination under anesthesia had revealed retroverted uterus, irregular, about 8- to 10-week size.  A weighted speculum was placed in the vagina. Sims retractor was placed anteriorly.  The 0 Vicryl figure-of-eight sutures were placed on the anterior and posterior lip of the cervix. The uterus sounded to 8 cm.  The cervix was easily dilated and a medium size RUMI cup with #8 uterine manipulator was introduced into the uterine cavity without incident.  The retractors were then removed and attention was then turned to the abdomen.  A transverse incision was made in the umbilical region, and the fascia was subsequently identified.  The fascia was then opened vertically.  Stay sutures were placed superiorly and inferiorly.  The GelPort was inserted without incident.  The camera port was then inserted and robotic camera was inserted and the pelvis inspected.  Normal liver edge was noted.  The uterus was irregular with pedunculated fibroid.  The ovaries were normal.  Tubes were  normal.  The cul-de-sac had no evidence of endometriosis.  The patient was deemed candidate for the single site approach.  At that point, the robot was docked to the camera.  The 250 robotic port was inserted in the usual fashion with the robot being docked with each arm, arm #1 and arm #2.  Once this was done, the permanent cautery hook was placed as well as the fenestrated bipolar cautery placed.  Once these were viewed, I then went to the surgical console.  At the surgical console, the pelvis was further inspected. The procedure was done on the right side with the retroperitoneal space opened on the right.  The fallopian tube was grasped.  The underlying mesosalpinx was cauterized and cut.  The retroperitoneal incision was then carried down further.  The round ligament was clamped, cauterized, and cut.  The anterior cul-de-sac was opened.  The vesicouterine peritoneum was opened transversely and the bladder displaced inferiorly. The posterior peritoneum was then opened transversely and carried down posteriorly as well.  The uterine vessels were then subsequently identified, serially clamped, cauterized, and then cut.  Attention was then turned to the contralateral side.  The ureter was identified deep in the pelvis.  The retroperitoneal space was opened on the left.  The fallopian tube was also removed, serially clamped, cauterized, and cut. The round ligament was also clamped, cauterized, and cut.  The  remaining vesicouterine peritoneum was then opened.  The bladder was further dissected off the RUMI cup and displaced inferiorly.  The uterine vessels were then serially clamped, cauterized, and cut on the left side.  Once this was done, the vaginal insufflator was placed and the KOH ring was used to identify the upper part of the vagina. Cervicovaginal junction was identified at the upper part of the KOH ring and was opened circumferentially.  The uterus was then separated from its  vaginal attachment and removed through the vagina.  The fallopian tubes which were separated at the time of the surgery was then removed through the vagina as well.  Bleeding on the vaginal cuff was cauterized.  The bladder flap was further developed and displaced inferiorly.  The permanent hook was then replaced with a wristed needle driver and 0 V-Loc suture was inserted.  The vaginal cuff was then closed with 0 V-Loc x2.  The sutures were then removed with needle.  The abdomen was then irrigated and suctioned.  Good hemostasis noted.  Both ovaries were noted to be otherwise unremarkable.  At that point, the instruments were then removed.  The robot undocked.  I then went back to the patient's bedside and sterilely the scope was then inserted and the pelvis once again inspected.  The abdomen was then thoroughly deflated. The pelvis was inspected.  Good hemostasis noted.  At that point, the GelPort was also removed.  The stay sutures were used to bring up the fascia.  The fascia was then closed with 0 PDS suture.  The stay sutures were then tied in the midline.  The umbilical stalk which had been transected at the time of the surgery was then repositioned on the fascia, and the skin was then approximated using 4-0 Monocryl sutures subcuticularly.  A pressure bandage was then placed.  Digital exam of the vaginal cuff showed well approximation.  SPECIMEN:  Uterus with cervix, tubes sent to Pathology.  ESTIMATED BLOOD LOSS:  100 mL.  INTRAOPERATIVE FLUID:  2 L.  URINE OUTPUT:  120 mL concentrated urine.  Sponge and instrument counts x2 was correct.  COMPLICATIONS:  None.  The patient tolerated the procedure well, was transferred to recovery in stable condition.     Mallory Fernandez, M.D.     [place]/[facility]  D:  04/23/2013  T:  04/24/2013  Job:  [id number]

## 2017-03-28 ENCOUNTER — Other Ambulatory Visit: Payer: Self-pay

## 2017-03-28 ENCOUNTER — Emergency Department (HOSPITAL_BASED_OUTPATIENT_CLINIC_OR_DEPARTMENT_OTHER): Payer: 59

## 2017-03-28 ENCOUNTER — Inpatient Hospital Stay (HOSPITAL_BASED_OUTPATIENT_CLINIC_OR_DEPARTMENT_OTHER)
Admission: EM | Admit: 2017-03-28 | Discharge: 2017-04-01 | DRG: 439 | Disposition: A | Discharge status: Home / Self Care | Payer: 59 | Attending: Internal Medicine | Admitting: Internal Medicine

## 2017-03-28 ENCOUNTER — Encounter (HOSPITAL_BASED_OUTPATIENT_CLINIC_OR_DEPARTMENT_OTHER): Payer: Self-pay | Admitting: *Deleted

## 2017-03-28 ENCOUNTER — Observation Stay (HOSPITAL_COMMUNITY): Payer: 59

## 2017-03-28 DIAGNOSIS — K852 Alcohol induced acute pancreatitis without necrosis or infection: Secondary | ICD-10-CM

## 2017-03-28 DIAGNOSIS — E876 Hypokalemia: Secondary | ICD-10-CM | POA: Diagnosis present

## 2017-03-28 DIAGNOSIS — Z87891 Personal history of nicotine dependence: Secondary | ICD-10-CM

## 2017-03-28 DIAGNOSIS — Z7289 Other problems related to lifestyle: Secondary | ICD-10-CM

## 2017-03-28 DIAGNOSIS — R188 Other ascites: Secondary | ICD-10-CM | POA: Diagnosis present

## 2017-03-28 DIAGNOSIS — K859 Acute pancreatitis without necrosis or infection, unspecified: Principal | ICD-10-CM | POA: Diagnosis present

## 2017-03-28 DIAGNOSIS — R945 Abnormal results of liver function studies: Secondary | ICD-10-CM | POA: Diagnosis not present

## 2017-03-28 DIAGNOSIS — Z90710 Acquired absence of both cervix and uterus: Secondary | ICD-10-CM

## 2017-03-28 DIAGNOSIS — F1010 Alcohol abuse, uncomplicated: Secondary | ICD-10-CM | POA: Diagnosis present

## 2017-03-28 DIAGNOSIS — Z789 Other specified health status: Secondary | ICD-10-CM

## 2017-03-28 DIAGNOSIS — Z79899 Other long term (current) drug therapy: Secondary | ICD-10-CM

## 2017-03-28 DIAGNOSIS — D539 Nutritional anemia, unspecified: Secondary | ICD-10-CM | POA: Diagnosis present

## 2017-03-28 DIAGNOSIS — I10 Essential (primary) hypertension: Secondary | ICD-10-CM | POA: Diagnosis present

## 2017-03-28 HISTORY — DX: Essential (primary) hypertension: I10

## 2017-03-28 HISTORY — DX: Alcohol induced acute pancreatitis without necrosis or infection: K85.20

## 2017-03-28 LAB — COMPREHENSIVE METABOLIC PANEL
ALT: 90 U/L — ABNORMAL HIGH (ref 14–54)
AST: 135 U/L — ABNORMAL HIGH (ref 15–41)
Albumin: 4.3 g/dL (ref 3.5–5.0)
Alkaline Phosphatase: 64 U/L (ref 38–126)
Anion gap: 13 (ref 5–15)
BUN: 7 mg/dL (ref 6–20)
CO2: 29 mmol/L (ref 22–32)
Calcium: 8.9 mg/dL (ref 8.9–10.3)
Chloride: 90 mmol/L — ABNORMAL LOW (ref 101–111)
Creatinine, Ser: 0.83 mg/dL (ref 0.44–1.00)
GFR calc Af Amer: >60 mL/min (ref >60)
GFR calc non Af Amer: >60 mL/min (ref >60)
Glucose, Bld: 142 mg/dL — ABNORMAL HIGH (ref 65–99)
Potassium: 3 mmol/L — ABNORMAL LOW (ref 3.5–5.1)
Sodium: 132 mmol/L — ABNORMAL LOW (ref 135–145)
Total Bilirubin: 1.2 mg/dL (ref 0.3–1.2)
Total Protein: 7.3 g/dL (ref 6.5–8.1)

## 2017-03-28 LAB — LIPASE, BLOOD: Lipase: 1243 U/L — ABNORMAL HIGH (ref 11–51)

## 2017-03-28 LAB — URINALYSIS, ROUTINE W REFLEX MICROSCOPIC
Glucose, UA: NEGATIVE mg/dL
Hgb urine dipstick: NEGATIVE
Ketones, ur: 15 mg/dL — AB
Leukocytes, UA: NEGATIVE
Nitrite: NEGATIVE
Protein, ur: NEGATIVE mg/dL
Specific Gravity, Urine: 1.01 (ref 1.005–1.030)
pH: 8 (ref 5.0–8.0)

## 2017-03-28 LAB — CBC
HCT: 31 % — ABNORMAL LOW (ref 36.0–46.0)
Hemoglobin: 11.3 g/dL — ABNORMAL LOW (ref 12.0–15.0)
MCH: 37 pg — ABNORMAL HIGH (ref 26.0–34.0)
MCHC: 36.5 g/dL — ABNORMAL HIGH (ref 30.0–36.0)
MCV: 101.6 fL — ABNORMAL HIGH (ref 78.0–100.0)
Platelets: 246 10*3/uL (ref 150–400)
RBC: 3.05 MIL/uL — ABNORMAL LOW (ref 3.87–5.11)
RDW: 11.6 % (ref 11.5–15.5)
WBC: 5.4 10*3/uL (ref 4.0–10.5)

## 2017-03-28 LAB — LIPID PANEL
Cholesterol: 138 mg/dL (ref 0–200)
HDL: 73 mg/dL (ref >40)
LDL Cholesterol: 52 mg/dL (ref 0–99)
Total CHOL/HDL Ratio: 1.9 RATIO
Triglycerides: 65 mg/dL (ref <150)
VLDL: 13 mg/dL (ref 0–40)

## 2017-03-28 LAB — MAGNESIUM: Magnesium: 1.9 mg/dL (ref 1.7–2.4)

## 2017-03-28 LAB — PREGNANCY, URINE: Preg Test, Ur: NEGATIVE

## 2017-03-28 LAB — HEMOGLOBIN A1C
Hgb A1c MFr Bld: 4.9 % (ref 4.8–5.6)
Mean Plasma Glucose: 93.93 mg/dL

## 2017-03-28 MED ORDER — SODIUM CHLORIDE 0.9 % IV BOLUS (SEPSIS)
1000.0000 mL | Freq: Once | INTRAVENOUS | Status: AC
  Administered 2017-03-28: 1000 mL via INTRAVENOUS

## 2017-03-28 MED ORDER — HYDROMORPHONE HCL 1 MG/ML IJ SOLN
1.0000 mg | INTRAMUSCULAR | Status: DC | PRN
  Administered 2017-03-28 – 2017-03-29 (×8): 1 mg via INTRAVENOUS
  Filled 2017-03-28 (×8): qty 1

## 2017-03-28 MED ORDER — LORAZEPAM 1 MG PO TABS: 1.0000 mg | ORAL_TABLET | Freq: Four times a day (QID) | ORAL | Status: AC | PRN

## 2017-03-28 MED ORDER — POTASSIUM CHLORIDE IN NACL 20-0.9 MEQ/L-% IV SOLN
INTRAVENOUS | Status: DC
  Administered 2017-03-28 – 2017-03-29 (×3): via INTRAVENOUS
  Filled 2017-03-28 (×4): qty 1000

## 2017-03-28 MED ORDER — VITAMIN B-1 100 MG PO TABS
100.0000 mg | ORAL_TABLET | Freq: Every day | ORAL | Status: DC
  Administered 2017-03-29 – 2017-04-01 (×4): 100 mg via ORAL
  Filled 2017-03-28 (×4): qty 1

## 2017-03-28 MED ORDER — LORAZEPAM 2 MG/ML IJ SOLN
1.0000 mg | Freq: Four times a day (QID) | INTRAMUSCULAR | Status: AC | PRN
Start: 2017-03-28 — End: 2017-03-31

## 2017-03-28 MED ORDER — HYDROMORPHONE HCL 1 MG/ML IJ SOLN
1.0000 mg | INTRAMUSCULAR | Status: DC | PRN
  Administered 2017-03-28: 1 mg via INTRAVENOUS
  Filled 2017-03-28: qty 1

## 2017-03-28 MED ORDER — ONDANSETRON HCL 4 MG/2ML IJ SOLN
4.0000 mg | Freq: Once | INTRAMUSCULAR | Status: AC | PRN
  Administered 2017-03-28: 4 mg via INTRAVENOUS
  Filled 2017-03-28: qty 2

## 2017-03-28 MED ORDER — SODIUM CHLORIDE 0.9 % IV SOLN
Freq: Once | INTRAVENOUS | Status: AC
  Administered 2017-03-28: 1000 mL via INTRAVENOUS

## 2017-03-28 MED ORDER — THIAMINE HCL 100 MG/ML IJ SOLN
100.0000 mg | Freq: Every day | INTRAMUSCULAR | Status: DC
  Administered 2017-03-28: 100 mg via INTRAVENOUS
  Filled 2017-03-28: qty 2

## 2017-03-28 MED ORDER — MORPHINE SULFATE (PF) 4 MG/ML IV SOLN
4.0000 mg | Freq: Once | INTRAVENOUS | Status: AC
  Administered 2017-03-28: 4 mg via INTRAVENOUS
  Filled 2017-03-28: qty 1

## 2017-03-28 MED ORDER — ENOXAPARIN SODIUM 40 MG/0.4ML ~~LOC~~ SOLN
40.0000 mg | SUBCUTANEOUS | Status: DC
  Administered 2017-03-28 – 2017-04-01 (×5): 40 mg via SUBCUTANEOUS
  Filled 2017-03-28 (×5): qty 0.4

## 2017-03-28 MED ORDER — ONDANSETRON HCL 4 MG/2ML IJ SOLN
4.0000 mg | Freq: Four times a day (QID) | INTRAMUSCULAR | Status: DC | PRN
  Administered 2017-03-28: 4 mg via INTRAVENOUS
  Filled 2017-03-28: qty 2

## 2017-03-28 MED ORDER — HYDROMORPHONE HCL 1 MG/ML IJ SOLN
1.0000 mg | Freq: Once | INTRAMUSCULAR | Status: AC
  Administered 2017-03-28: 1 mg via INTRAVENOUS
  Filled 2017-03-28: qty 1

## 2017-03-28 MED ORDER — BOOST / RESOURCE BREEZE PO LIQD
1.0000 | Freq: Three times a day (TID) | ORAL | Status: DC
  Administered 2017-03-28: 1 via ORAL

## 2017-03-28 MED ORDER — ADULT MULTIVITAMIN W/MINERALS CH
1.0000 | ORAL_TABLET | Freq: Every day | ORAL | Status: DC
  Administered 2017-03-28 – 2017-04-01 (×4): 1 via ORAL
  Filled 2017-03-28 (×4): qty 1

## 2017-03-28 MED ORDER — FOLIC ACID 1 MG PO TABS
1.0000 mg | ORAL_TABLET | Freq: Every day | ORAL | Status: DC
  Administered 2017-03-28 – 2017-04-01 (×4): 1 mg via ORAL
  Filled 2017-03-28 (×6): qty 1

## 2017-03-28 MED ORDER — IOPAMIDOL (ISOVUE-300) INJECTION 61%
100.0000 mL | Freq: Once | INTRAVENOUS | Status: AC | PRN
  Administered 2017-03-28: 100 mL via INTRAVENOUS

## 2017-03-28 NOTE — H&P (Signed)
History and Physical    Mallory Fernandez:[id number] DOB: [birth year] DOA: 03/28/2017  Referring MD/NP/PA: ward PCP: Lucianne Lei, MD Outpatient Specialists:  Patient coming from: [facility]-- home  Chief Complaint: abdominal pain  HPI: Mallory Fernandez is a 41 y.o. female with medical history significant of HTN.  She presents with a  day of abdominal pain that radiated to her back.  She has had vomiting but no diarrhea.  Few days of chills but no dysuria.  She has no history of prior pancreatitis but does drink 3-4 beers 5 out of 7 days of the week.  She did drink more heavily than usual as her cousin was in from out of town.  She has also been on BP medications including HCTZ for the last 2 years.  In the ER, lipase was found to be elevated >1000, a low K, and a  CT scan of abdomen was done that showed: Diffuse free fluid throughout the abdomen and pelvis with edema and infiltration in the mesenteric and peripancreatic fat. Differential diagnosis includes edema from pancreatitis, peritoneal carcinomatosis/metastasis, or ascites.   Review of Systems: all systems reviewed, negative unless stated above in HPI   Past Medical History:  Diagnosis Date  . Hypertension   . Tendonitis of shoulder 2009   bil shoulders     Past Surgical History:  Procedure Laterality Date  . ABDOMINAL HYSTERECTOMY    . BILATERAL SALPINGECTOMY Bilateral 04/23/2013   Procedure: BILATERAL SALPINGECTOMY;  Surgeon: Marvene Staff, MD;  Location: [facility];  Service: Gynecology;  Laterality: Bilateral;  . TUBAL LIGATION       reports that she has quit smoking. she has never used smokeless tobacco. She reports that she drinks about 1.2 oz of alcohol per week. She reports that she does not use drugs.  No Known Allergies  History reviewed. No pertinent family history.   Prior to Admission medications   Medication Sig Start Date End Date Taking? Authorizing Provider  Olmesartan-Amlodipine-HCTZ  (TRIBENZOR) 20-5-12.5 MG TABS Take by mouth.   Yes [provider]  Multiple Vitamin (MULITIVITAMIN WITH MINERALS) TABS Take 1 tablet by mouth 2 (two) times daily. Mega Woman Multivitamin from [facility]    [provider]  Omega-3 Fatty Acids (FISH OIL) 1000 MG CAPS Take 1 capsule by mouth daily.    [provider]    Physical Exam: Vitals:   03/28/17 0159 03/28/17 0200 03/28/17 0532 03/28/17 0804  BP: (!) 122/103  119/88 121/83  Pulse: (!) 110  81 65  Resp: 18  16 16   Temp: 98.3 F (36.8 C)     TempSrc: Oral     SpO2: 100%  96% 98%  Weight:  63.5 kg (140 lb)    Height:  5\' 8"  (1.727 m)        Constitutional: NAD, calm, comfortable Vitals:   03/28/17 0159 03/28/17 0200 03/28/17 0532 03/28/17 0804  BP: (!) 122/103  119/88 121/83  Pulse: (!) 110  81 65  Resp: 18  16 16   Temp: 98.3 F (36.8 C)     TempSrc: Oral     SpO2: 100%  96% 98%  Weight:  63.5 kg (140 lb)    Height:  5\' 8"  (1.727 m)     Eyes: PERRL, lids and conjunctivae normal ENMT: Mucous membranes are moist. Posterior pharynx clear of any exudate or lesions.Normal dentition.  Neck: normal, supple, no masses, no thyromegaly Respiratory: clear to auscultation bilaterally, no wheezing, no crackles. Normal respiratory effort. No accessory  muscle use.  Cardiovascular: Regular rate and rhythm, no murmurs / rubs / gallops. No extremity edema. 2+ pedal pulses. No carotid bruits.  Abdomen: epigastric tenderness, no masses palpated. No hepatosplenomegaly. Bowel sounds positive.  Musculoskeletal: no clubbing / cyanosis. No joint deformity upper and lower extremities. Good ROM, no contractures. Normal muscle tone.  Skin: no rashes, lesions, ulcers. No induration Neurologic: CN 2-12 grossly intact. Sensation intact, DTR normal. Strength 5/5 in all 4.  Psychiatric: Normal judgment and insight. Alert and oriented x 3. Normal mood.    Labs on Admission: I have personally reviewed following labs and imaging  studies  CBC: Recent Labs  Lab 03/28/17 0240  WBC 5.4  HGB 11.3*  HCT 31.0*  MCV 101.6*  PLT 660   Basic Metabolic Panel: Recent Labs  Lab 03/28/17 0240  NA 132*  K 3.0*  CL 90*  CO2 29  GLUCOSE 142*  BUN 7  CREATININE 0.83  CALCIUM 8.9   GFR: Estimated Creatinine Clearance: 87.6 mL/min (by C-G formula based on SCr of 0.83 mg/dL). Liver Function Tests: Recent Labs  Lab 03/28/17 0240  AST 135*  ALT 90*  ALKPHOS 64  BILITOT 1.2  PROT 7.3  ALBUMIN 4.3   Recent Labs  Lab 03/28/17 0240  LIPASE 1,243*   No results for input(s): AMMONIA in the last 168 hours. Coagulation Profile: No results for input(s): INR, PROTIME in the last 168 hours. Cardiac Enzymes: No results for input(s): CKTOTAL, CKMB, CKMBINDEX, TROPONINI in the last 168 hours. BNP (last 3 results) No results for input(s): PROBNP in the last 8760 hours. HbA1C: No results for input(s): HGBA1C in the last 72 hours. CBG: No results for input(s): GLUCAP in the last 168 hours. Lipid Profile: No results for input(s): CHOL, HDL, LDLCALC, TRIG, CHOLHDL, LDLDIRECT in the last 72 hours. Thyroid Function Tests: No results for input(s): TSH, T4TOTAL, FREET4, T3FREE, THYROIDAB in the last 72 hours. Anemia Panel: No results for input(s): VITAMINB12, FOLATE, FERRITIN, TIBC, IRON, RETICCTPCT in the last 72 hours. Urine analysis:    Component Value Date/Time   COLORURINE YELLOW 03/28/2017 0200   APPEARANCEUR CLEAR 03/28/2017 0200   LABSPEC 1.010 03/28/2017 0200   PHURINE 8.0 03/28/2017 0200   GLUCOSEU NEGATIVE 03/28/2017 0200   HGBUR NEGATIVE 03/28/2017 0200   BILIRUBINUR SMALL (A) 03/28/2017 0200   KETONESUR 15 (A) 03/28/2017 0200   PROTEINUR NEGATIVE 03/28/2017 0200   NITRITE NEGATIVE 03/28/2017 0200   LEUKOCYTESUR NEGATIVE 03/28/2017 0200   Sepsis Labs: Invalid input(s): PROCALCITONIN, LACTICIDVEN No results found for this or any previous visit (from the past 240 hour(s)).   Radiological Exams on  Admission: Ct Abdomen Pelvis W Contrast  Result Date: 03/28/2017 CLINICAL DATA:  Left upper abdominal pain since yesterday. Nausea and vomiting. EXAM: CT ABDOMEN AND PELVIS WITH CONTRAST TECHNIQUE: Multidetector CT imaging of the abdomen and pelvis was performed using the standard protocol following bolus administration of intravenous contrast. CONTRAST:  186mL ISOVUE-300 IOPAMIDOL (ISOVUE-300) INJECTION 61% COMPARISON:  None. FINDINGS: Lower chest: Lung bases are clear. Hepatobiliary: Diffuse fatty infiltration of the liver. Focal areas of relative decreased density in the central liver and along the falciform ligament likely representing asymmetrical increased fat. No definite focal liver lesions. Gallbladder and bile ducts are unremarkable. Pancreas: Homogeneous appearance of the pancreatic parenchyma. No focal pancreatic mass is identified. No pancreatic ductal dilatation. Spleen: Normal in size without focal abnormality. Adrenals/Urinary Tract: Adrenal glands are unremarkable. Kidneys are normal, without renal calculi, focal lesion, or hydronephrosis. Bladder is unremarkable. Stomach/Bowel:  Stomach is within normal limits. Appendix is not identified. No evidence of bowel wall thickening, distention, or inflammatory changes. Vascular/Lymphatic: Aortic atherosclerosis. No enlarged abdominal or pelvic lymph nodes. Reproductive: Uterus is surgically absent. No abnormal adnexal masses. Other: There is free fluid diffusely throughout the abdomen and pelvis with edema and infiltration in the mesenteric and peripancreatic fat. Mesenteric fat has a somewhat nodular appearance. Differential diagnosis would include edema from pancreatitis, peritoneal carcinomatosis/metastasis, or mild ascites with proteinaceous fluid. Laboratory correlation recommended. Musculoskeletal: No acute or significant osseous findings. IMPRESSION: 1. Diffuse free fluid throughout the abdomen and pelvis with edema and infiltration in the  mesenteric and peripancreatic fat. Differential diagnosis includes edema from pancreatitis, peritoneal carcinomatosis/metastasis, or ascites. Laboratory correlation recommended. 2. Diffuse fatty infiltration of the liver. 3. Aortic atherosclerosis Electronically Signed   By: Lucienne Capers M.D.   On: 03/28/2017 04:58      Assessment/Plan Active Problems:   Pancreatitis   Hypokalemia   Alcohol use   Free fluid in abdomen due to ?acute pancreatitis - see plan below  Abdominal pain due to Acute pancreatitis -cause: medication vs alcohol use -RUQ U/S to r/o GB issues -FLP/HgbA1c -NPO except ice chips, advance as tolerated to clears -IVF -IV Pain medications  Elevated LFTs -? Alcohol related -recheck in AM -RUQ U/S pending  Alcohol use -CIWA protocol  Hypokalemia -replace in IVF -check Mg -recheck in AM    DVT prophylaxis: lovenox Code Status: full Family Communication: bedside Disposition Plan: home once tolerating PO Consults called:  Admission status: med surge obs   Geradine Girt DO [facility] Pager [phone]  If 7PM-7AM, please contact night-coverage [email or web address] Password [facility]  03/28/2017, 9:04 AM

## 2017-03-28 NOTE — Progress Notes (Signed)
Spoke to Dr. Leonides Schanz 41 year old female with a history of alcohol abuse came with abdominal pain.  Lipase elevated 1243, CT abdomen and pelvis shows mesenteric edema consistent with ascites, or edema from pancreatitis.  Patient will need IV hydration with normal saline.  CIWA protocol.

## 2017-03-28 NOTE — ED Notes (Signed)
Generalized abd pain worse on left onset 2000 last pm w n/v,  Denies ua problems

## 2017-03-28 NOTE — ED Provider Notes (Signed)
TIME SEEN: 3:08 AM  CHIEF COMPLAINT: Abdominal pain  HPI: Patient is a 41 year old female with history of hypertension who presents with abdominal pain mostly in the upper abdomen that started around 8 PM last night.  Describes as sharp and severe without radiation.  Has had 3 episodes of vomiting.  No diarrhea.  No dysuria, hematuria, vaginal bleeding or discharge.  She is status post hysterectomy.  Has tried Alka-Seltzer and Pepto-Bismol without relief.  Has never had similar symptoms.  ROS: See HPI Constitutional: no fever  Eyes: no drainage  ENT: no runny nose   Cardiovascular:  no chest pain  Resp: no SOB  GI:  vomiting GU: no dysuria Integumentary: no rash  Allergy: no hives  Musculoskeletal: no leg swelling  Neurological: no slurred speech ROS otherwise negative  PAST MEDICAL HISTORY/PAST SURGICAL HISTORY:  Past Medical History:  Diagnosis Date  . Hypertension   . Tendonitis of shoulder 2009   bil shoulders     MEDICATIONS:  Prior to Admission medications   Medication Sig Start Date End Date Taking? Authorizing Provider  Olmesartan-Amlodipine-HCTZ (TRIBENZOR) 20-5-12.5 MG TABS Take by mouth.   Yes [provider]  Multiple Vitamin (MULITIVITAMIN WITH MINERALS) TABS Take 1 tablet by mouth 2 (two) times daily. Mega Woman Multivitamin from [facility]    [provider]  Omega-3 Fatty Acids (FISH OIL) 1000 MG CAPS Take 1 capsule by mouth daily.    [provider]    ALLERGIES:  No Known Allergies  SOCIAL HISTORY:  Social History   Tobacco Use  . Smoking status: Former Smoker  Substance Use Topics  . Alcohol use: Yes    Alcohol/week: 1.2 oz    Types: 2 Cans of beer per week    FAMILY HISTORY: History reviewed. No pertinent family history.  EXAM: BP (!) 122/103 (BP Location: Left Arm) Comment: pt tense in triage.  Pulse (!) 110   Temp 98.3 F (36.8 C) (Oral)   Resp 18   Ht 5\' 8"  (1.727 m)   Wt 63.5 kg (140 lb)   LMP 03/31/2013   SpO2  100%   BMI 21.29 kg/m  CONSTITUTIONAL: Alert and oriented and responds appropriately to questions. Well-appearing; well-nourished HEAD: Normocephalic EYES: Conjunctivae clear, pupils appear equal, EOMI ENT: normal nose; moist mucous membranes NECK: Supple, no meningismus, no nuchal rigidity, no LAD  CARD: RRR; S1 and S2 appreciated; no murmurs, no clicks, no rubs, no gallops RESP: Normal chest excursion without splinting or tachypnea; breath sounds clear and equal bilaterally; no wheezes, no rhonchi, no rales, no hypoxia or respiratory distress, speaking full sentences ABD/GI: Normal bowel sounds; non-distended; soft, tender throughout the entire abdomen but especially in the upper abdomen, negative Murphy sign, no rebound, no guarding, no peritoneal signs, no hepatosplenomegaly BACK:  The back appears normal and is non-tender to palpation, there is no CVA tenderness EXT: Normal ROM in all joints; non-tender to palpation; no edema; normal capillary refill; no cyanosis, no calf tenderness or swelling    SKIN: Normal color for age and race; warm; no rash NEURO: Moves all extremities equally PSYCH: The patient's mood and manner are appropriate. Grooming and personal hygiene are appropriate.  MEDICAL DECISION MAKING: Patient here with upper abdominal pain.  Tender diffusely however.  Differential is large including cholelithiasis, cholecystitis, pancreatitis, colitis, bowel obstruction, appendicitis, diverticulitis.  Will obtain labs, urine, CT of abdomen and pelvis.  Will give IV fluids, pain and nausea medicine.  ED PROGRESS: Labs show a lipase of 1243.  Mild  elevation of AST and ALT.  Otherwise labs, urine unremarkable.  CT scan shows acute pancreatitis without necrosis or mass.  No pseudocyst.  Morphine improved her pain slightly.  Will give dose of Dilaudid for pain control.  She is not vomiting here.  We will continue to hydrate patient but have recommended admission.  She does report that she  drinks three 24 ounce beers every day.  She denies ever having any withdrawal symptoms on days that she does not drink.  I feel this is likely the cause of her pancreatitis.  PCP is Dr. Criss Rosales.  She is comfortable with admission to [facility].   5:36 AM Discussed patient's case with hospitalist, Dr. Darrick Meigs.  I have recommended admission and patient (and family if present) agree with this plan. Admitting physician will place admission orders.   I reviewed all nursing notes, vitals, pertinent previous records, EKGs, lab and urine results, imaging (as available).     [place], Delice Bison, DO 03/28/17 [phone]

## 2017-03-28 NOTE — ED Triage Notes (Signed)
Pt reports abdominal pain that started at 4 pm.  Pt in obvious pain in triage. Vomiting, denies diarrhea.

## 2017-03-29 DIAGNOSIS — D539 Nutritional anemia, unspecified: Secondary | ICD-10-CM | POA: Diagnosis present

## 2017-03-29 DIAGNOSIS — Z90710 Acquired absence of both cervix and uterus: Secondary | ICD-10-CM | POA: Diagnosis not present

## 2017-03-29 DIAGNOSIS — Z87891 Personal history of nicotine dependence: Secondary | ICD-10-CM | POA: Diagnosis not present

## 2017-03-29 DIAGNOSIS — E876 Hypokalemia: Secondary | ICD-10-CM | POA: Diagnosis present

## 2017-03-29 DIAGNOSIS — Z789 Other specified health status: Secondary | ICD-10-CM | POA: Diagnosis not present

## 2017-03-29 DIAGNOSIS — R188 Other ascites: Secondary | ICD-10-CM | POA: Diagnosis present

## 2017-03-29 DIAGNOSIS — I10 Essential (primary) hypertension: Secondary | ICD-10-CM | POA: Diagnosis present

## 2017-03-29 DIAGNOSIS — K859 Acute pancreatitis without necrosis or infection, unspecified: Secondary | ICD-10-CM | POA: Diagnosis present

## 2017-03-29 DIAGNOSIS — K852 Alcohol induced acute pancreatitis without necrosis or infection: Secondary | ICD-10-CM | POA: Diagnosis present

## 2017-03-29 DIAGNOSIS — F1010 Alcohol abuse, uncomplicated: Secondary | ICD-10-CM | POA: Diagnosis present

## 2017-03-29 DIAGNOSIS — Z79899 Other long term (current) drug therapy: Secondary | ICD-10-CM | POA: Diagnosis not present

## 2017-03-29 LAB — COMPREHENSIVE METABOLIC PANEL
ALT: 54 U/L (ref 14–54)
AST: 63 U/L — ABNORMAL HIGH (ref 15–41)
Albumin: 3.2 g/dL — ABNORMAL LOW (ref 3.5–5.0)
Alkaline Phosphatase: 51 U/L (ref 38–126)
Anion gap: 7 (ref 5–15)
BUN: <5 mg/dL — ABNORMAL LOW (ref 6–20)
CO2: 27 mmol/L (ref 22–32)
Calcium: 7.7 mg/dL — ABNORMAL LOW (ref 8.9–10.3)
Chloride: 104 mmol/L (ref 101–111)
Creatinine, Ser: 0.66 mg/dL (ref 0.44–1.00)
GFR calc Af Amer: >60 mL/min (ref >60)
GFR calc non Af Amer: >60 mL/min (ref >60)
Glucose, Bld: 93 mg/dL (ref 65–99)
Potassium: 3.4 mmol/L — ABNORMAL LOW (ref 3.5–5.1)
Sodium: 138 mmol/L (ref 135–145)
Total Bilirubin: 0.8 mg/dL (ref 0.3–1.2)
Total Protein: 6.1 g/dL — ABNORMAL LOW (ref 6.5–8.1)

## 2017-03-29 LAB — CBC
HCT: 30.4 % — ABNORMAL LOW (ref 36.0–46.0)
Hemoglobin: 10.5 g/dL — ABNORMAL LOW (ref 12.0–15.0)
MCH: 37 pg — ABNORMAL HIGH (ref 26.0–34.0)
MCHC: 34.5 g/dL (ref 30.0–36.0)
MCV: 107 fL — ABNORMAL HIGH (ref 78.0–100.0)
Platelets: 238 10*3/uL (ref 150–400)
RBC: 2.84 MIL/uL — ABNORMAL LOW (ref 3.87–5.11)
RDW: 13 % (ref 11.5–15.5)
WBC: 8 10*3/uL (ref 4.0–10.5)

## 2017-03-29 LAB — HIV ANTIBODY (ROUTINE TESTING W REFLEX): HIV Screen 4th Generation wRfx: NONREACTIVE

## 2017-03-29 MED ORDER — AMLODIPINE BESYLATE 10 MG PO TABS
10.0000 mg | ORAL_TABLET | Freq: Every day | ORAL | Status: DC
  Administered 2017-03-29 – 2017-03-31 (×3): 10 mg via ORAL
  Filled 2017-03-29 (×4): qty 1

## 2017-03-29 MED ORDER — NALOXONE HCL 0.4 MG/ML IJ SOLN: 0.4000 mg | INTRAMUSCULAR | Status: DC | PRN

## 2017-03-29 MED ORDER — SODIUM CHLORIDE 0.9 % IV BOLUS (SEPSIS)
1000.0000 mL | Freq: Once | INTRAVENOUS | Status: AC
  Administered 2017-03-29: 1000 mL via INTRAVENOUS

## 2017-03-29 MED ORDER — IRBESARTAN 75 MG PO TABS
75.0000 mg | ORAL_TABLET | Freq: Every day | ORAL | Status: DC
  Administered 2017-03-29 – 2017-04-01 (×4): 75 mg via ORAL
  Filled 2017-03-29 (×4): qty 1

## 2017-03-29 MED ORDER — ONDANSETRON HCL 4 MG/2ML IJ SOLN: 4.0000 mg | Freq: Four times a day (QID) | INTRAMUSCULAR | Status: DC | PRN

## 2017-03-29 MED ORDER — HYDROMORPHONE 1 MG/ML IV SOLN
INTRAVENOUS | Status: DC
  Administered 2017-03-29: 1.5 mg via INTRAVENOUS
  Administered 2017-03-29: 08:00:00 via INTRAVENOUS
  Administered 2017-03-29: 3.2 mg via INTRAVENOUS
  Administered 2017-03-29: 0.9 mg via INTRAVENOUS
  Administered 2017-03-30: 0.3 mg via INTRAVENOUS
  Administered 2017-03-30: 1.2 mg via INTRAVENOUS
  Filled 2017-03-29: qty 25

## 2017-03-29 MED ORDER — POTASSIUM CHLORIDE 2 MEQ/ML IV SOLN
INTRAVENOUS | Status: DC
  Administered 2017-03-29: 18:00:00 via INTRAVENOUS
  Filled 2017-03-29 (×5): qty 1000

## 2017-03-29 MED ORDER — SODIUM CHLORIDE 0.9% FLUSH
9.0000 mL | INTRAVENOUS | Status: DC | PRN
Start: 2017-03-29 — End: 2017-03-30

## 2017-03-29 MED ORDER — DIPHENHYDRAMINE HCL 50 MG/ML IJ SOLN: 12.5000 mg | Freq: Four times a day (QID) | INTRAMUSCULAR | Status: DC | PRN

## 2017-03-29 MED ORDER — ACETAMINOPHEN 325 MG PO TABS
650.0000 mg | ORAL_TABLET | Freq: Four times a day (QID) | ORAL | Status: DC | PRN
  Administered 2017-03-29 – 2017-03-30 (×2): 650 mg via ORAL
  Filled 2017-03-29 (×2): qty 2

## 2017-03-29 MED ORDER — VALACYCLOVIR HCL 500 MG PO TABS: 500.0000 mg | ORAL_TABLET | Freq: Two times a day (BID) | ORAL | Status: DC

## 2017-03-29 MED ORDER — DIPHENHYDRAMINE HCL 12.5 MG/5ML PO ELIX: 12.5000 mg | ORAL_SOLUTION | Freq: Four times a day (QID) | ORAL | Status: DC | PRN

## 2017-03-29 MED ORDER — HYDRALAZINE HCL 20 MG/ML IJ SOLN: 5.0000 mg | INTRAMUSCULAR | Status: DC | PRN

## 2017-03-29 MED ORDER — VALACYCLOVIR HCL 500 MG PO TABS
1000.0000 mg | ORAL_TABLET | Freq: Two times a day (BID) | ORAL | Status: DC
  Administered 2017-03-29: 1000 mg via ORAL
  Filled 2017-03-29: qty 2

## 2017-03-29 NOTE — Care Management Note (Signed)
Case Management Note  Patient Details  Name: Mallory Fernandez MRN: [id number] Date of Birth: June 05, 1975  Subjective/Objective:                  pancreatitis  Action/Plan: Date: March 29, 2017 Velva Harman, BSN, [place], [place] Chart and notes review for patient progress and needs. Will follow for case management and discharge needs. Next review date: [id number]  Expected Discharge Date:                  Expected Discharge Plan:  Home/Self Care  In-House Referral:     Discharge planning Services  CM Consult  Post Acute Care Choice:    Choice offered to:     DME Arranged:    DME Agency:     HH Arranged:    HH Agency:     Status of Service:  In process, will continue to follow  If discussed at Long Length of Stay Meetings, dates discussed:    Additional Comments:  Leeroy Cha, RN 03/29/2017, 9:04 AM

## 2017-03-29 NOTE — Progress Notes (Addendum)
[facility] PROGRESS NOTE    Progress Note  Mallory Fernandez  YOV:[id number] DOB: 08/21/1975 DOA: 03/28/2017 PCP: Lucianne Lei, MD     Brief Narrative:   Mallory Fernandez is an 41 y.o. female past medical history of alcoholic pancreatitis came in [place] or [place] with abdominal pain radiating to her back, nausea no diarrhea fever chills or dysuria. She relates she drank alcohol 3-4 years 5 out of 7 days, she relates is more than usual as her cousin from town.   Assessment/Plan:   Alcoholic pancreatitis Likely due to alcohol abuse. Right upper quadrant ultrasound, showed no stones, HIV pregnancy test were negative. A1c was 4.9, fasting lipid panel shows triglycerides of less than 100 CT scan of the abdomen and pelvis shows no acute abnormalities. Continue IV fluids nothing by mouth and narcotics for pain. Place her on a PCA pump.  Ascites fluid: She is afebrile with no leukocytosis to slightly due to acute pancreatitis.  Hypokalemia: Hypokalemia improving likely due to vomiting.  Alcohol use Monitor with Ativan protocol. Continue thiamine and folate.  Macrocytic anemia: Likely due to alcohol abuse. Will need follow-up with PCP.   DVT prophylaxis: Lovenox Family Communication:none Disposition Plan/Barrier to D/C: home in 2-3 days Code Status:     Code Status Orders  (From admission, onward)        Start     Ordered   03/28/17 0901  Full code  Continuous     03/28/17 0900    Code Status History    Date Active Date Inactive Code Status Order ID Comments User Context   04/23/2013 20:17 04/24/2013 13:57 Full Code [id number]  Marvene Staff, MD Inpatient        IV Access:    Peripheral IV   Procedures and diagnostic studies:   Ct Abdomen Pelvis W Contrast  Result Date: 03/28/2017 CLINICAL DATA:  Left upper abdominal pain since yesterday. Nausea and vomiting. EXAM: CT ABDOMEN AND PELVIS WITH CONTRAST TECHNIQUE: Multidetector  CT imaging of the abdomen and pelvis was performed using the standard protocol following bolus administration of intravenous contrast. CONTRAST:  19mL ISOVUE-300 IOPAMIDOL (ISOVUE-300) INJECTION 61% COMPARISON:  None. FINDINGS: Lower chest: Lung bases are clear. Hepatobiliary: Diffuse fatty infiltration of the liver. Focal areas of relative decreased density in the central liver and along the falciform ligament likely representing asymmetrical increased fat. No definite focal liver lesions. Gallbladder and bile ducts are unremarkable. Pancreas: Homogeneous appearance of the pancreatic parenchyma. No focal pancreatic mass is identified. No pancreatic ductal dilatation. Spleen: Normal in size without focal abnormality. Adrenals/Urinary Tract: Adrenal glands are unremarkable. Kidneys are normal, without renal calculi, focal lesion, or hydronephrosis. Bladder is unremarkable. Stomach/Bowel: Stomach is within normal limits. Appendix is not identified. No evidence of bowel wall thickening, distention, or inflammatory changes. Vascular/Lymphatic: Aortic atherosclerosis. No enlarged abdominal or pelvic lymph nodes. Reproductive: Uterus is surgically absent. No abnormal adnexal masses. Other: There is free fluid diffusely throughout the abdomen and pelvis with edema and infiltration in the mesenteric and peripancreatic fat. Mesenteric fat has a somewhat nodular appearance. Differential diagnosis would include edema from pancreatitis, peritoneal carcinomatosis/metastasis, or mild ascites with proteinaceous fluid. Laboratory correlation recommended. Musculoskeletal: No acute or significant osseous findings. IMPRESSION: 1. Diffuse free fluid throughout the abdomen and pelvis with edema and infiltration in the mesenteric and peripancreatic fat. Differential diagnosis includes edema from pancreatitis, peritoneal carcinomatosis/metastasis, or ascites. Laboratory correlation recommended. 2. Diffuse fatty infiltration of the liver.  3. Aortic atherosclerosis Electronically Signed  By: Lucienne Capers M.D.   On: 03/28/2017 04:58   US Abdomen Limited Ruq  Result Date: 03/28/2017 CLINICAL DATA:  Pancreatitis EXAM: ULTRASOUND ABDOMEN LIMITED RIGHT UPPER QUADRANT COMPARISON:  CT abdomen pelvis 03/28/2017 FINDINGS: Gallbladder: No gallstones or wall thickening visualized. No sonographic Murphy sign noted by sonographer. Common bile duct: Diameter: 3.3 mm Liver: No focal lesion identified. Within normal limits in parenchymal echogenicity. Portal vein is patent on color Doppler imaging with normal direction of blood flow towards the liver. Small amount of ascites IMPRESSION: Negative for gallstones.  No biliary dilatation Small amount of ascites around the liver. Electronically Signed   By: Franchot Gallo M.D.   On: 03/28/2017 10:30     Medical Consultants:    None.  Anti-Infectives:   none  Subjective:    Mallory Fernandez she was continue Synthroid half pain some nausea but no vomiting, she relates the pain medication only last about 2 hours.  Objective:    Vitals:   03/28/17 1256 03/28/17 1632 03/28/17 2054 03/29/17 0524  BP: 109/82 115/83 117/75 129/84  Pulse: 69 72 92 88  Resp: 17 18 18 18   Temp: 97.8 F (36.6 C) (!) 97.4 F (36.3 C) 98.4 F (36.9 C) 98.6 F (37 C)  TempSrc: Oral Oral Oral Oral  SpO2: 100% 100% 99% 98%  Weight:      Height:        Intake/Output Summary (Last 24 hours) at 03/29/2017 0741 Last data filed at 03/28/2017 1500 Gross per 24 hour  Intake 1137.5 ml  Output -  Net 1137.5 ml   Filed Weights   03/28/17 0200  Weight: 63.5 kg (140 lb)    Exam: General exam: In no acute distress. Respiratory system: Good air movement and clear to auscultation. Cardiovascular system: S1 & S2 heard, RRR.  Gastrointestinal system: Abdomen is nondistended, soft and nontender.  Central nervous system: Alert and oriented. No focal neurological deficits. Extremities: No pedal  edema. Skin: No rashes, lesions or ulcers Psychiatry: Judgement and insight appear normal. Mood & affect appropriate.    Data Reviewed:    Labs: Basic Metabolic Panel: Recent Labs  Lab 03/28/17 0240 03/28/17 0900 03/29/17 0527  NA 132*  --  138  K 3.0*  --  3.4*  CL 90*  --  104  CO2 29  --  27  GLUCOSE 142*  --  93  BUN 7  --  <5*  CREATININE 0.83  --  0.66  CALCIUM 8.9  --  7.7*  MG  --  1.9  --    GFR Estimated Creatinine Clearance: 90.9 mL/min (by C-G formula based on SCr of 0.66 mg/dL). Liver Function Tests: Recent Labs  Lab 03/28/17 0240 03/29/17 0527  AST 135* 63*  ALT 90* 54  ALKPHOS 64 51  BILITOT 1.2 0.8  PROT 7.3 6.1*  ALBUMIN 4.3 3.2*   Recent Labs  Lab 03/28/17 0240  LIPASE 1,243*   No results for input(s): AMMONIA in the last 168 hours. Coagulation profile No results for input(s): INR, PROTIME in the last 168 hours.  CBC: Recent Labs  Lab 03/28/17 0240 03/29/17 0527  WBC 5.4 8.0  HGB 11.3* 10.5*  HCT 31.0* 30.4*  MCV 101.6* 107.0*  PLT 246 238   Cardiac Enzymes: No results for input(s): CKTOTAL, CKMB, CKMBINDEX, TROPONINI in the last 168 hours. BNP (last 3 results) No results for input(s): PROBNP in the last 8760 hours. CBG: No results for input(s): GLUCAP in the last 168 hours.  D-Dimer: No results for input(s): DDIMER in the last 72 hours. Hgb A1c: Recent Labs    03/28/17 0900  HGBA1C 4.9   Lipid Profile: Recent Labs    03/28/17 0900  CHOL 138  HDL 73  LDLCALC 52  TRIG 65  CHOLHDL 1.9   Thyroid function studies: No results for input(s): TSH, T4TOTAL, T3FREE, THYROIDAB in the last 72 hours.  Invalid input(s): FREET3 Anemia work up: No results for input(s): VITAMINB12, FOLATE, FERRITIN, TIBC, IRON, RETICCTPCT in the last 72 hours. Sepsis Labs: Recent Labs  Lab 03/28/17 0240 03/29/17 0527  WBC 5.4 8.0   Microbiology No results found for this or any previous visit (from the past 240 hour(s)).   Medications:    . enoxaparin (LOVENOX) injection  40 mg Subcutaneous Q24H  . feeding supplement  1 Container Oral TID BM  . folic acid  1 mg Oral Daily  . multivitamin with minerals  1 tablet Oral Daily  . thiamine  100 mg Oral Daily   Or  . thiamine  100 mg Intravenous Daily   Continuous Infusions: . 0.9 % NaCl with KCl 20 mEq / L 125 mL/hr at 03/29/17 0246      LOS: 0 days   Charlynne Cousins  [facility] Pager [phone]  *Please refer to [email or web address], password [email or web address] to get updated schedule on who will round on this patient, as hospitalists switch teams weekly. If 7PM-7AM, please contact night-coverage at [email or web address], password [email or web address] for any overnight needs.  03/29/2017, 7:41 AM

## 2017-03-29 NOTE — Progress Notes (Signed)
Initial Nutrition Assessment  DOCUMENTATION CODES:   Not applicable  INTERVENTION:   Monitor for diet advancement and provide medically appropriate nutritional intervention  NUTRITION DIAGNOSIS:   Inadequate oral intake related to inability to eat as evidenced by NPO status.  GOAL:   Patient will meet greater than or equal to 90% of their needs  MONITOR:   Diet advancement, Weight trends, I & O's  REASON FOR ASSESSMENT:   Malnutrition Screening Tool    ASSESSMENT:   Pt with PMH of HTN presents with abdominal pain that radiated to her back found pancreatitis likely r/t alcohol absue.   Pt reports intentionally losing 10 lbs, from 155 lbs to 145 lbs, as instructed by her OB/GYN. Pt reports she continued to lose weight due to healthy habits and increased activity level. Pt reports going to the gym >/= 3 times/week for cardio, pt with shoulder tendonitis and can no longer lift weights.   Pt reports a fair-good appetite at baseline. Consuming a protein shake (powder and water) for breakfast; burrito bowl form [employer] or vegetable plate from cafeteria for lunch; and a meat and vegetable of some kind for dinner. Per chart, pt consumes alcohol 5 out of 7 days/week.   Educated pt on the importance of consuming adequate nutrition. Pt reports disliking most carbohydrate foods but reports enjoying beans and corn, etc.   Pt reports the last meal she consumed was 2 days ago. Pt currently NPO. Pt may benefit from nutritional supplement, such as Premier Protein, once diet is advanced.   Labs reviewed; K 3.4, BUN <5, Albumin 3.2, Hemoglobin A1C 4.9 (WNL),  Medications reviewed; folic acid, thiamine, multivitamin  NUTRITION - FOCUSED PHYSICAL EXAM:    Most Recent Value  Orbital Region  No depletion  Upper Arm Region  No depletion  Thoracic and Lumbar Region  No depletion  Buccal Region  Mild depletion  Temple Region  Mild depletion  Clavicle Bone Region  Mild depletion  Clavicle and  Acromion Bone Region  No depletion  Scapular Bone Region  No depletion  Dorsal Hand  No depletion  Patellar Region  No depletion  Anterior Thigh Region  No depletion  Posterior Calf Region  No depletion  Edema (RD Assessment)  None      Diet Order:  Diet NPO time specified Except for: Sips with Meds  EDUCATION NEEDS:   No education needs have been identified at this time  Skin:  Skin Assessment: Reviewed RN Assessment  Last BM:  03/26/17  Height:   Ht Readings from Last 1 Encounters:  03/28/17 5\' 8"  (1.727 m)    Weight:   Wt Readings from Last 1 Encounters:  03/28/17 140 lb (63.5 kg)    Ideal Body Weight:  63.6 kg  BMI:  Body mass index is 21.29 kg/m.  Estimated Nutritional Needs:   Kcal:  1600-1800  Protein:  80-90 grams  Fluid:  >/= 1.6 L/d  Par[place] Ranger, MS, RDN, LDN 03/29/2017 1:29 PM

## 2017-03-30 DIAGNOSIS — E876 Hypokalemia: Secondary | ICD-10-CM

## 2017-03-30 DIAGNOSIS — Z789 Other specified health status: Secondary | ICD-10-CM

## 2017-03-30 DIAGNOSIS — K852 Alcohol induced acute pancreatitis without necrosis or infection: Secondary | ICD-10-CM

## 2017-03-30 LAB — BASIC METABOLIC PANEL
Anion gap: 7 (ref 5–15)
BUN: <5 mg/dL — ABNORMAL LOW (ref 6–20)
CO2: 26 mmol/L (ref 22–32)
Calcium: 7.7 mg/dL — ABNORMAL LOW (ref 8.9–10.3)
Chloride: 104 mmol/L (ref 101–111)
Creatinine, Ser: 0.58 mg/dL (ref 0.44–1.00)
GFR calc Af Amer: >60 mL/min (ref >60)
GFR calc non Af Amer: >60 mL/min (ref >60)
Glucose, Bld: 95 mg/dL (ref 65–99)
Potassium: 3 mmol/L — ABNORMAL LOW (ref 3.5–5.1)
Sodium: 137 mmol/L (ref 135–145)

## 2017-03-30 MED ORDER — DIPHENHYDRAMINE HCL 12.5 MG/5ML PO ELIX: 12.5000 mg | ORAL_SOLUTION | Freq: Four times a day (QID) | ORAL | Status: DC | PRN

## 2017-03-30 MED ORDER — ONDANSETRON HCL 4 MG/2ML IJ SOLN
4.0000 mg | Freq: Four times a day (QID) | INTRAMUSCULAR | Status: DC | PRN
  Administered 2017-03-30: 4 mg via INTRAVENOUS
  Filled 2017-03-30: qty 2

## 2017-03-30 MED ORDER — MORPHINE SULFATE 2 MG/ML IV SOLN
INTRAVENOUS | Status: DC
  Administered 2017-03-30: 5 mg via INTRAVENOUS
  Administered 2017-03-30: 09:00:00 via INTRAVENOUS
  Administered 2017-03-30: 6.7 mg via INTRAVENOUS
  Administered 2017-03-31: 10 mg via INTRAVENOUS
  Administered 2017-03-31: 09:00:00 via INTRAVENOUS
  Administered 2017-03-31: 3 mg via INTRAVENOUS
  Administered 2017-03-31: 5 mg via INTRAVENOUS
  Administered 2017-03-31: 13.5 mg via INTRAVENOUS
  Administered 2017-04-01: 17 mg via INTRAVENOUS
  Administered 2017-04-01: 4 mg via INTRAVENOUS
  Administered 2017-04-01: 11 mg via INTRAVENOUS
  Filled 2017-03-30 (×3): qty 30

## 2017-03-30 MED ORDER — DIPHENHYDRAMINE HCL 50 MG/ML IJ SOLN: 12.5000 mg | Freq: Four times a day (QID) | INTRAMUSCULAR | Status: DC | PRN

## 2017-03-30 MED ORDER — NALOXONE HCL 0.4 MG/ML IJ SOLN: 0.4000 mg | INTRAMUSCULAR | Status: DC | PRN

## 2017-03-30 MED ORDER — SODIUM CHLORIDE 0.9% FLUSH: 9.0000 mL | INTRAVENOUS | Status: DC | PRN

## 2017-03-30 NOTE — Progress Notes (Signed)
[facility] PROGRESS NOTE    Progress Note  Mallory Fernandez  IFO:[id number] DOB: 1976-03-16 DOA: 03/28/2017 PCP: Lucianne Lei, MD     Brief Narrative:   Mallory Fernandez is an 41 y.o. female past medical history of alcoholic pancreatitis came in [place] or [place] with abdominal pain radiating to her back, nausea no diarrhea fever chills or dysuria. She relates she drank alcohol 3-4 years 5 out of 7 days, she relates is more than usual as her cousin from town.   Assessment/Plan:   Alcoholic pancreatitis Likely due to alcohol abuse, she is only use 1.6 mg of Dilaudid in the last 24 hours. We will change her to reduce dose of morphine.  We will keep her n.p.o. only ice chips. She relates her stomach is still hurting and she would like not to eat. Continue D5 half-normal saline with 20 of K basic metabolic panel is pending.  Ascites fluid: She is afebrile with no leukocytosis to slightly due to acute pancreatitis.  Hypokalemia: Was improving basic metabolic panel is pending.   Alcohol use Monitor with Ativan protocol. Continue thiamine and folate.  Macrocytic anemia: Likely due to alcohol abuse. Will need follow-up with PCP.   DVT prophylaxis: Lovenox Family Communication:none Disposition Plan/Barrier to D/C: home in 2 days Code Status:     Code Status Orders  (From admission, onward)        Start     Ordered   03/28/17 0901  Full code  Continuous     03/28/17 0900    Code Status History    Date Active Date Inactive Code Status Order ID Comments User Context   04/23/2013 20:17 04/24/2013 13:57 Full Code [id number]  Marvene Staff, MD Inpatient        IV Access:    Peripheral IV   Procedures and diagnostic studies:   US Abdomen Limited Ruq  Result Date: 03/28/2017 CLINICAL DATA:  Pancreatitis EXAM: ULTRASOUND ABDOMEN LIMITED RIGHT UPPER QUADRANT COMPARISON:  CT abdomen pelvis 03/28/2017 FINDINGS: Gallbladder: No gallstones  or wall thickening visualized. No sonographic Murphy sign noted by sonographer. Common bile duct: Diameter: 3.3 mm Liver: No focal lesion identified. Within normal limits in parenchymal echogenicity. Portal vein is patent on color Doppler imaging with normal direction of blood flow towards the liver. Small amount of ascites IMPRESSION: Negative for gallstones.  No biliary dilatation Small amount of ascites around the liver. Electronically Signed   By: Franchot Gallo M.D.   On: 03/28/2017 10:30     Medical Consultants:    None.  Anti-Infectives:   none  Subjective:    Mallory Fernandez she relates her pain is controlled, she would like not to eat today.  Objective:    Vitals:   03/30/17 0043 03/30/17 0355 03/30/17 0533 03/30/17 0557  BP:   128/90   Pulse:   86   Resp: 18 18 17 18   Temp:   98.5 F (36.9 C)   TempSrc:   Oral   SpO2: 97% 97% 98% 97%  Weight:      Height:        Intake/Output Summary (Last 24 hours) at 03/30/2017 0749 Last data filed at 03/30/2017 0600 Gross per 24 hour  Intake 1200 ml  Output -  Net 1200 ml   Filed Weights   03/28/17 0200  Weight: 63.5 kg (140 lb)    Exam: General exam: In no acute distress. Respiratory system: Good air movement and clear to auscultation. Cardiovascular system: S1 &  S2 heard, RRR.  Gastrointestinal system: Abdomen is nondistended, soft and nontender.  Central nervous system: Alert and oriented. No focal neurological deficits. Extremities: No pedal edema. Skin: No rashes, lesions or ulcers Psychiatry: Judgement and insight appear normal. Mood & affect appropriate.    Data Reviewed:    Labs: Basic Metabolic Panel: Recent Labs  Lab 03/28/17 0240 03/28/17 0900 03/29/17 0527  NA 132*  --  138  K 3.0*  --  3.4*  CL 90*  --  104  CO2 29  --  27  GLUCOSE 142*  --  93  BUN 7  --  <5*  CREATININE 0.83  --  0.66  CALCIUM 8.9  --  7.7*  MG  --  1.9  --    GFR Estimated Creatinine Clearance: 90.9 mL/min  (by C-G formula based on SCr of 0.66 mg/dL). Liver Function Tests: Recent Labs  Lab 03/28/17 0240 03/29/17 0527  AST 135* 63*  ALT 90* 54  ALKPHOS 64 51  BILITOT 1.2 0.8  PROT 7.3 6.1*  ALBUMIN 4.3 3.2*   Recent Labs  Lab 03/28/17 0240  LIPASE 1,243*   No results for input(s): AMMONIA in the last 168 hours. Coagulation profile No results for input(s): INR, PROTIME in the last 168 hours.  CBC: Recent Labs  Lab 03/28/17 0240 03/29/17 0527  WBC 5.4 8.0  HGB 11.3* 10.5*  HCT 31.0* 30.4*  MCV 101.6* 107.0*  PLT 246 238   Cardiac Enzymes: No results for input(s): CKTOTAL, CKMB, CKMBINDEX, TROPONINI in the last 168 hours. BNP (last 3 results) No results for input(s): PROBNP in the last 8760 hours. CBG: No results for input(s): GLUCAP in the last 168 hours. D-Dimer: No results for input(s): DDIMER in the last 72 hours. Hgb A1c: Recent Labs    03/28/17 0900  HGBA1C 4.9   Lipid Profile: Recent Labs    03/28/17 0900  CHOL 138  HDL 73  LDLCALC 52  TRIG 65  CHOLHDL 1.9   Thyroid function studies: No results for input(s): TSH, T4TOTAL, T3FREE, THYROIDAB in the last 72 hours.  Invalid input(s): FREET3 Anemia work up: No results for input(s): VITAMINB12, FOLATE, FERRITIN, TIBC, IRON, RETICCTPCT in the last 72 hours. Sepsis Labs: Recent Labs  Lab 03/28/17 0240 03/29/17 0527  WBC 5.4 8.0   Microbiology No results found for this or any previous visit (from the past 240 hour(s)).   Medications:   . amLODipine  10 mg Oral Daily  . enoxaparin (LOVENOX) injection  40 mg Subcutaneous Q24H  . folic acid  1 mg Oral Daily  . HYDROmorphone   Intravenous Q4H  . irbesartan  75 mg Oral Daily  . multivitamin with minerals  1 tablet Oral Daily  . thiamine  100 mg Oral Daily   Or  . thiamine  100 mg Intravenous Daily   Continuous Infusions: . dextrose 5 % and 0.45% NaCl 1,000 mL with potassium chloride 20 mEq infusion 100 mL/hr at 03/29/17 1742      LOS: 1 day    [facility] Pager [phone]  *Please refer to [email or web address], password [email or web address] to get updated schedule on who will round on this patient, as hospitalists switch teams weekly. If 7PM-7AM, please contact night-coverage at [email or web address], password [email or web address] for any overnight needs.  03/30/2017, 7:49 AM

## 2017-03-31 DIAGNOSIS — D539 Nutritional anemia, unspecified: Secondary | ICD-10-CM

## 2017-03-31 MED ORDER — POTASSIUM CHLORIDE 10 MEQ/100ML IV SOLN
10.0000 meq | INTRAVENOUS | Status: DC
  Filled 2017-03-31 (×5): qty 100

## 2017-03-31 MED ORDER — POTASSIUM CHLORIDE 10 MEQ/100ML IV SOLN
10.0000 meq | INTRAVENOUS | Status: AC
  Administered 2017-03-31 (×3): 10 meq via INTRAVENOUS
  Filled 2017-03-31 (×3): qty 100

## 2017-03-31 MED ORDER — SODIUM CHLORIDE 0.9 % IV SOLN
INTRAVENOUS | Status: AC
  Administered 2017-03-31: 13:00:00 via INTRAVENOUS
  Filled 2017-03-31: qty 1000

## 2017-03-31 MED ORDER — POTASSIUM CHLORIDE 10 MEQ/100ML IV SOLN
10.0000 meq | INTRAVENOUS | Status: AC
  Administered 2017-03-31 (×3): 10 meq via INTRAVENOUS
  Filled 2017-03-31 (×9): qty 100

## 2017-03-31 NOTE — Progress Notes (Signed)
[facility] PROGRESS NOTE    Progress Note  Mallory Fernandez  PYP:[id number] DOB: 08-04-1975 DOA: 03/28/2017 PCP: Lucianne Lei, MD     Brief Narrative:   Mallory Fernandez is an 41 y.o. female past medical history of alcoholic pancreatitis came in [place] or [place] with abdominal pain radiating to her back, nausea no diarrhea fever chills or dysuria. She relates she drank alcohol 3-4 years 5 out of 7 days, she relates is more than usual as her cousin from town.   Assessment/Plan:   Alcoholic pancreatitis Likely due to alcohol abuse, she relates her pain is better. She has not use narcotics for the last 8 hours. She would like to try a clear liquid diet.  Ascites fluid: She is afebrile with no leukocytosis to slightly due to acute pancreatitis.  Hypokalemia: Replete IV and change her IV fluids to normal saline with potassium.   Alcohol use Monitor with Ativan protocol. Continue thiamine and folate.  Macrocytic anemia: Likely due to alcohol abuse. Will need follow-up with PCP.   DVT prophylaxis: Lovenox Family Communication:none Disposition Plan/Barrier to D/C: home in 1 days Code Status:     Code Status Orders  (From admission, onward)        Start     Ordered   03/28/17 0901  Full code  Continuous     03/28/17 0900    Code Status History    Date Active Date Inactive Code Status Order ID Comments User Context   04/23/2013 20:17 04/24/2013 13:57 Full Code [id number]  Marvene Staff, MD Inpatient        IV Access:    Peripheral IV   Procedures and diagnostic studies:   No results found.   Medical Consultants:    None.  Anti-Infectives:   none  Subjective:    [name] she relates her pain is controlled, she would like not to eat today.  Objective:    Vitals:   03/31/17 0008 03/31/17 0400 03/31/17 0500 03/31/17 0544  BP: 123/84   123/84  Pulse: 72   81  Resp: 18 18 18  (!) 21  Temp: (!) 97.5 F  (36.4 C)   98.2 F (36.8 C)  TempSrc: Oral   Oral  SpO2: 100% 100% 100% 96%  Weight:      Height:        Intake/Output Summary (Last 24 hours) at 03/31/2017 0746 Last data filed at 03/31/2017 0500 Gross per 24 hour  Intake 2150 ml  Output -  Net 2150 ml   Filed Weights   03/28/17 0200  Weight: 63.5 kg (140 lb)    Exam: General exam: In no acute distress. Respiratory system: Good air movement and clear to auscultation. Cardiovascular system: S1 & S2 heard, RRR.  Gastrointestinal system: Abdomen is soft nontender nondistended. Central nervous system: Alert and oriented. No focal neurological deficits. Extremities: No pedal edema. Skin: No rashes, lesions or ulcers Psychiatry: Judgement and insight appear normal. Mood & affect appropriate.    Data Reviewed:    Labs: Basic Metabolic Panel: Recent Labs  Lab 03/28/17 0240 03/28/17 0900 03/29/17 0527 03/30/17 0617  NA 132*  --  138 137  K 3.0*  --  3.4* 3.0*  CL 90*  --  104 104  CO2 29  --  27 26  GLUCOSE 142*  --  93 95  BUN 7  --  <5* <5*  CREATININE 0.83  --  0.66 0.58  CALCIUM 8.9  --  7.7* 7.7*  MG  --  1.9  --   --    GFR Estimated Creatinine Clearance: 90.9 mL/min (by C-G formula based on SCr of 0.58 mg/dL). Liver Function Tests: Recent Labs  Lab 03/28/17 0240 03/29/17 0527  AST 135* 63*  ALT 90* 54  ALKPHOS 64 51  BILITOT 1.2 0.8  PROT 7.3 6.1*  ALBUMIN 4.3 3.2*   Recent Labs  Lab 03/28/17 0240  LIPASE 1,243*   No results for input(s): AMMONIA in the last 168 hours. Coagulation profile No results for input(s): INR, PROTIME in the last 168 hours.  CBC: Recent Labs  Lab 03/28/17 0240 03/29/17 0527  WBC 5.4 8.0  HGB 11.3* 10.5*  HCT 31.0* 30.4*  MCV 101.6* 107.0*  PLT 246 238   Cardiac Enzymes: No results for input(s): CKTOTAL, CKMB, CKMBINDEX, TROPONINI in the last 168 hours. BNP (last 3 results) No results for input(s): PROBNP in the last 8760 hours. CBG: No results for  input(s): GLUCAP in the last 168 hours. D-Dimer: No results for input(s): DDIMER in the last 72 hours. Hgb A1c: Recent Labs    03/28/17 0900  HGBA1C 4.9   Lipid Profile: Recent Labs    03/28/17 0900  CHOL 138  HDL 73  LDLCALC 52  TRIG 65  CHOLHDL 1.9   Thyroid function studies: No results for input(s): TSH, T4TOTAL, T3FREE, THYROIDAB in the last 72 hours.  Invalid input(s): FREET3 Anemia work up: No results for input(s): VITAMINB12, FOLATE, FERRITIN, TIBC, IRON, RETICCTPCT in the last 72 hours. Sepsis Labs: Recent Labs  Lab 03/28/17 0240 03/29/17 0527  WBC 5.4 8.0   Microbiology No results found for this or any previous visit (from the past 240 hour(s)).   Medications:   . amLODipine  10 mg Oral Daily  . enoxaparin (LOVENOX) injection  40 mg Subcutaneous Q24H  . folic acid  1 mg Oral Daily  . irbesartan  75 mg Oral Daily  . morphine   Intravenous Q4H  . multivitamin with minerals  1 tablet Oral Daily  . thiamine  100 mg Oral Daily   Or  . thiamine  100 mg Intravenous Daily   Continuous Infusions: . dextrose 5 % and 0.45% NaCl 1,000 mL with potassium chloride 20 mEq infusion 100 mL/hr at 03/29/17 1742      LOS: 2 days   [facility] Pager [phone]  *Please refer to [email or web address], password [email or web address] to get updated schedule on who will round on this patient, as hospitalists switch teams weekly. If 7PM-7AM, please contact night-coverage at [email or web address], password [email or web address] for any overnight needs.  03/31/2017, 7:46 AM

## 2017-04-01 NOTE — Progress Notes (Signed)
Pts Iv removed with a clean and dry dressing intact. Pt ambulated to the front entrance with nursing staff present. Pt denies any pain at the time of d/c.

## 2017-04-01 NOTE — Progress Notes (Signed)
Wasted 1mg  of PCA Morphine with RN Memory Argue

## 2017-04-01 NOTE — Plan of Care (Signed)
  Nutrition: Adequate nutrition will be maintained 04/01/2017 0145 - Progressing by Mickie Kay, RN

## 2017-04-01 NOTE — Discharge Summary (Signed)
Physician Discharge Summary  Mallory Fernandez RKY:[id number] DOB: 09-20-1975 DOA: 03/28/2017  PCP: Lucianne Lei, MD  Admit date: 03/28/2017 Discharge date: 04/01/2017  Admitted From: home Disposition:  Home  Recommendations for Outpatient Follow-up:  1. Follow up with PCP in 1-2 weeks   Home Health:no Equipment/Devices:None  Discharge Condition:stable CODE STATUS:full Diet recommendation: Heart Healthy   Brief/Interim Summary: 41 year old female with past medical history of alcoholic pancreatitis comes into the med center [facility] with abdominal pain radiating to her back with nausea no fever or chills she relates she binge drink several days prior to coming to the ED.  Discharge Diagnoses:  Active Problems:   Alcoholic pancreatitis   Hypokalemia   Alcohol use   Macrocytic anemia  Alcoholic pancreatitis: She was treated conservatively with IV fluids n.p.o. and narcotics for pain. Her pain improved she was tolerating her diet well she was discharged in stable condition.  Ascites fluid: She remained afebrile with no leukocytosis antibiotics were not started empirically. [place] due to pancreatitis.  Hypokalemia: This was repleted now resolved.  Alcohol use: She was monitored with Ativan protocol she was started on thiamine and folate.  Macrocytic anemia: Likely due to alcohol abuse will need to follow-up with PCP.  Discharge Instructions  Discharge Instructions    Diet - low sodium heart healthy   Complete by:  As directed    Increase activity slowly   Complete by:  As directed      Allergies as of 04/01/2017   No Known Allergies     Medication List    TAKE these medications   aspirin-sod bicarb-citric acid 325 MG Tbef tablet Commonly known as:  ALKA-SELTZER Take 325 mg by mouth every 8 (eight) hours as needed (heartburn).   bismuth subsalicylate 315 VV/61YW suspension Commonly known as:  PEPTO BISMOL Take 30 mLs by mouth every 4 (four) hours as  needed for indigestion.   Fish Oil 1000 MG Caps Take 1 capsule by mouth daily.   multivitamin with minerals Tabs tablet Take 1 tablet by mouth 2 (two) times daily. Mega Woman Multivitamin from [place] 20-5-12.5 MG Tabs Generic drug:  Olmesartan-Amlodipine-HCTZ Take by mouth.   valACYclovir 500 MG tablet Commonly known as:  VALTREX Take 500 mg by mouth 2 (two) times daily as needed (for flare ups). Takes 500 mg twice a day for 3 days when she has flare ups       No Known Allergies  Consultations:  None   Procedures/Studies: Ct Abdomen Pelvis W Contrast  Result Date: 03/28/2017 CLINICAL DATA:  Left upper abdominal pain since yesterday. Nausea and vomiting. EXAM: CT ABDOMEN AND PELVIS WITH CONTRAST TECHNIQUE: Multidetector CT imaging of the abdomen and pelvis was performed using the standard protocol following bolus administration of intravenous contrast. CONTRAST:  153mL ISOVUE-300 IOPAMIDOL (ISOVUE-300) INJECTION 61% COMPARISON:  None. FINDINGS: Lower chest: Lung bases are clear. Hepatobiliary: Diffuse fatty infiltration of the liver. Focal areas of relative decreased density in the central liver and along the falciform ligament likely representing asymmetrical increased fat. No definite focal liver lesions. Gallbladder and bile ducts are unremarkable. Pancreas: Homogeneous appearance of the pancreatic parenchyma. No focal pancreatic mass is identified. No pancreatic ductal dilatation. Spleen: Normal in size without focal abnormality. Adrenals/Urinary Tract: Adrenal glands are unremarkable. Kidneys are normal, without renal calculi, focal lesion, or hydronephrosis. Bladder is unremarkable. Stomach/Bowel: Stomach is within normal limits. Appendix is not identified. No evidence of bowel wall thickening, distention, or inflammatory changes. Vascular/Lymphatic: Aortic atherosclerosis. No enlarged abdominal  or pelvic lymph nodes. Reproductive: Uterus is surgically absent. No abnormal  adnexal masses. Other: There is free fluid diffusely throughout the abdomen and pelvis with edema and infiltration in the mesenteric and peripancreatic fat. Mesenteric fat has a somewhat nodular appearance. Differential diagnosis would include edema from pancreatitis, peritoneal carcinomatosis/metastasis, or mild ascites with proteinaceous fluid. Laboratory correlation recommended. Musculoskeletal: No acute or significant osseous findings. IMPRESSION: 1. Diffuse free fluid throughout the abdomen and pelvis with edema and infiltration in the mesenteric and peripancreatic fat. Differential diagnosis includes edema from pancreatitis, peritoneal carcinomatosis/metastasis, or ascites. Laboratory correlation recommended. 2. Diffuse fatty infiltration of the liver. 3. Aortic atherosclerosis Electronically Signed   By: Lucienne Capers M.D.   On: 03/28/2017 04:58   US Abdomen Limited Ruq  Result Date: 03/28/2017 CLINICAL DATA:  Pancreatitis EXAM: ULTRASOUND ABDOMEN LIMITED RIGHT UPPER QUADRANT COMPARISON:  CT abdomen pelvis 03/28/2017 FINDINGS: Gallbladder: No gallstones or wall thickening visualized. No sonographic Murphy sign noted by sonographer. Common bile duct: Diameter: 3.3 mm Liver: No focal lesion identified. Within normal limits in parenchymal echogenicity. Portal vein is patent on color Doppler imaging with normal direction of blood flow towards the liver. Small amount of ascites IMPRESSION: Negative for gallstones.  No biliary dilatation Small amount of ascites around the liver. Electronically Signed   By: Franchot Gallo M.D.   On: 03/28/2017 10:30      Subjective: No complaints Discharge Exam: Vitals:   04/01/17 0545 04/01/17 0906  BP:    Pulse:    Resp: 18 19  Temp:    SpO2: 99% 98%   Vitals:   04/01/17 0236 04/01/17 0535 04/01/17 0545 04/01/17 0906  BP:  (!) 136/93    Pulse:  69    Resp: 20 19 18 19   Temp:  98.3 F (36.8 C)    TempSrc:  Oral    SpO2: 100% 96% 99% 98%  Weight:       Height:        General: Pt is alert, awake, not in acute distress Cardiovascular: RRR, S1/S2 +, no rubs, no gallops Respiratory: CTA bilaterally, no wheezing, no rhonchi Abdominal: Soft, NT, ND, bowel sounds + Extremities: no edema, no cyanosis    The results of significant diagnostics from this hospitalization (including imaging, microbiology, ancillary and laboratory) are listed below for reference.     Microbiology: No results found for this or any previous visit (from the past 240 hour(s)).   Labs: BNP (last 3 results) No results for input(s): BNP in the last 8760 hours. Basic Metabolic Panel: Recent Labs  Lab 03/28/17 0240 03/28/17 0900 03/29/17 0527 03/30/17 0617  NA 132*  --  138 137  K 3.0*  --  3.4* 3.0*  CL 90*  --  104 104  CO2 29  --  27 26  GLUCOSE 142*  --  93 95  BUN 7  --  <5* <5*  CREATININE 0.83  --  0.66 0.58  CALCIUM 8.9  --  7.7* 7.7*  MG  --  1.9  --   --    Liver Function Tests: Recent Labs  Lab 03/28/17 0240 03/29/17 0527  AST 135* 63*  ALT 90* 54  ALKPHOS 64 51  BILITOT 1.2 0.8  PROT 7.3 6.1*  ALBUMIN 4.3 3.2*   Recent Labs  Lab 03/28/17 0240  LIPASE 1,243*   No results for input(s): AMMONIA in the last 168 hours. CBC: Recent Labs  Lab 03/28/17 0240 03/29/17 0527  WBC 5.4 8.0  HGB 11.3*  10.5*  HCT 31.0* 30.4*  MCV 101.6* 107.0*  PLT 246 238   Cardiac Enzymes: No results for input(s): CKTOTAL, CKMB, CKMBINDEX, TROPONINI in the last 168 hours. BNP: Invalid input(s): POCBNP CBG: No results for input(s): GLUCAP in the last 168 hours. D-Dimer No results for input(s): DDIMER in the last 72 hours. Hgb A1c No results for input(s): HGBA1C in the last 72 hours. Lipid Profile No results for input(s): CHOL, HDL, LDLCALC, TRIG, CHOLHDL, LDLDIRECT in the last 72 hours. Thyroid function studies No results for input(s): TSH, T4TOTAL, T3FREE, THYROIDAB in the last 72 hours.  Invalid input(s): FREET3 Anemia work up No results  for input(s): VITAMINB12, FOLATE, FERRITIN, TIBC, IRON, RETICCTPCT in the last 72 hours. Urinalysis    Component Value Date/Time   COLORURINE YELLOW 03/28/2017 0200   APPEARANCEUR CLEAR 03/28/2017 0200   LABSPEC 1.010 03/28/2017 0200   PHURINE 8.0 03/28/2017 0200   GLUCOSEU NEGATIVE 03/28/2017 0200   HGBUR NEGATIVE 03/28/2017 0200   BILIRUBINUR SMALL (A) 03/28/2017 0200   KETONESUR 15 (A) 03/28/2017 0200   PROTEINUR NEGATIVE 03/28/2017 0200   NITRITE NEGATIVE 03/28/2017 0200   LEUKOCYTESUR NEGATIVE 03/28/2017 0200   Sepsis Labs Invalid input(s): PROCALCITONIN,  WBC,  LACTICIDVEN Microbiology No results found for this or any previous visit (from the past 240 hour(s)).   Time coordinating discharge: Over 30 minutes  SIGNED:   Charlynne Cousins, MD  [facility] 04/01/2017, 9:23 AM Pager   If 7PM-7AM, please contact night-coverage [email or web address] Password [email or web address]

## 2017-09-09 DIAGNOSIS — M25531 Pain in right wrist: Secondary | ICD-10-CM | POA: Insufficient documentation

## 2017-10-28 DIAGNOSIS — G5601 Carpal tunnel syndrome, right upper limb: Secondary | ICD-10-CM | POA: Insufficient documentation

## 2019-01-29 ENCOUNTER — Encounter (HOSPITAL_BASED_OUTPATIENT_CLINIC_OR_DEPARTMENT_OTHER): Payer: Self-pay | Admitting: *Deleted

## 2019-01-29 ENCOUNTER — Emergency Department (HOSPITAL_BASED_OUTPATIENT_CLINIC_OR_DEPARTMENT_OTHER)
Admission: EM | Admit: 2019-01-29 | Discharge: 2019-01-29 | Disposition: A | Discharge status: Home / Self Care | Payer: 59 | Attending: Emergency Medicine | Admitting: Emergency Medicine

## 2019-01-29 ENCOUNTER — Other Ambulatory Visit: Payer: Self-pay

## 2019-01-29 DIAGNOSIS — Z79899 Other long term (current) drug therapy: Secondary | ICD-10-CM | POA: Insufficient documentation

## 2019-01-29 DIAGNOSIS — Z87891 Personal history of nicotine dependence: Secondary | ICD-10-CM | POA: Diagnosis not present

## 2019-01-29 DIAGNOSIS — I10 Essential (primary) hypertension: Secondary | ICD-10-CM | POA: Diagnosis not present

## 2019-01-29 DIAGNOSIS — R1012 Left upper quadrant pain: Secondary | ICD-10-CM | POA: Diagnosis present

## 2019-01-29 DIAGNOSIS — K852 Alcohol induced acute pancreatitis without necrosis or infection: Secondary | ICD-10-CM | POA: Insufficient documentation

## 2019-01-29 LAB — COMPREHENSIVE METABOLIC PANEL
ALT: 63 U/L — ABNORMAL HIGH (ref 0–44)
AST: 71 U/L — ABNORMAL HIGH (ref 15–41)
Albumin: 4.6 g/dL (ref 3.5–5.0)
Alkaline Phosphatase: 69 U/L (ref 38–126)
Anion gap: 18 — ABNORMAL HIGH (ref 5–15)
BUN: 8 mg/dL (ref 6–20)
CO2: 26 mmol/L (ref 22–32)
Calcium: 9.3 mg/dL (ref 8.9–10.3)
Chloride: 88 mmol/L — ABNORMAL LOW (ref 98–111)
Creatinine, Ser: 0.92 mg/dL (ref 0.44–1.00)
GFR calc Af Amer: >60 mL/min (ref >60)
GFR calc non Af Amer: >60 mL/min (ref >60)
Glucose, Bld: 106 mg/dL — ABNORMAL HIGH (ref 70–99)
Potassium: 3.2 mmol/L — ABNORMAL LOW (ref 3.5–5.1)
Sodium: 132 mmol/L — ABNORMAL LOW (ref 135–145)
Total Bilirubin: 0.9 mg/dL (ref 0.3–1.2)
Total Protein: 7.9 g/dL (ref 6.5–8.1)

## 2019-01-29 LAB — CBC WITH DIFFERENTIAL/PLATELET
Abs Immature Granulocytes: 0.01 10*3/uL (ref 0.00–0.07)
Basophils Absolute: 0 10*3/uL (ref 0.0–0.1)
Basophils Relative: 0 %
Eosinophils Absolute: 0 10*3/uL (ref 0.0–0.5)
Eosinophils Relative: 0 %
HCT: 38.4 % (ref 36.0–46.0)
Hemoglobin: 13.1 g/dL (ref 12.0–15.0)
Immature Granulocytes: 0 %
Lymphocytes Relative: 12 %
Lymphs Abs: 0.9 10*3/uL (ref 0.7–4.0)
MCH: 34.8 pg — ABNORMAL HIGH (ref 26.0–34.0)
MCHC: 34.1 g/dL (ref 30.0–36.0)
MCV: 102.1 fL — ABNORMAL HIGH (ref 80.0–100.0)
Monocytes Absolute: 0.4 10*3/uL (ref 0.1–1.0)
Monocytes Relative: 6 %
Neutro Abs: 5.8 10*3/uL (ref 1.7–7.7)
Neutrophils Relative %: 82 %
Platelets: 221 10*3/uL (ref 150–400)
RBC: 3.76 MIL/uL — ABNORMAL LOW (ref 3.87–5.11)
RDW: 14 % (ref 11.5–15.5)
WBC: 7.1 10*3/uL (ref 4.0–10.5)
nRBC: 0 % (ref 0.0–0.2)

## 2019-01-29 LAB — URINALYSIS, ROUTINE W REFLEX MICROSCOPIC
Bilirubin Urine: NEGATIVE
Glucose, UA: NEGATIVE mg/dL
Hgb urine dipstick: NEGATIVE
Ketones, ur: 15 mg/dL — AB
Leukocytes,Ua: NEGATIVE
Nitrite: NEGATIVE
Protein, ur: NEGATIVE mg/dL
Specific Gravity, Urine: 1.015 (ref 1.005–1.030)
pH: 8.5 — ABNORMAL HIGH (ref 5.0–8.0)

## 2019-01-29 LAB — LACTIC ACID, PLASMA: Lactic Acid, Venous: 0.9 mmol/L (ref 0.5–1.9)

## 2019-01-29 LAB — LIPASE, BLOOD: Lipase: 797 U/L — ABNORMAL HIGH (ref 11–51)

## 2019-01-29 MED ORDER — LACTATED RINGERS IV BOLUS
1000.0000 mL | Freq: Once | INTRAVENOUS | Status: AC
  Administered 2019-01-29: 1000 mL via INTRAVENOUS

## 2019-01-29 MED ORDER — HYDROMORPHONE HCL 1 MG/ML IJ SOLN
1.0000 mg | Freq: Once | INTRAMUSCULAR | Status: AC
  Administered 2019-01-29: 19:00:00 1 mg via INTRAVENOUS
  Filled 2019-01-29: qty 1

## 2019-01-29 MED ORDER — ONDANSETRON 4 MG PO TBDP: 4.0000 mg | ORAL_TABLET | Freq: Three times a day (TID) | ORAL | 0 refills | Status: DC | PRN

## 2019-01-29 MED ORDER — ONDANSETRON HCL 4 MG/2ML IJ SOLN
4.0000 mg | Freq: Once | INTRAMUSCULAR | Status: AC
Start: 2019-01-29 — End: 2019-01-29
  Administered 2019-01-29: 4 mg via INTRAVENOUS
  Filled 2019-01-29: qty 2

## 2019-01-29 MED ORDER — OXYCODONE-ACETAMINOPHEN 5-325 MG PO TABS: 1.0000 | ORAL_TABLET | ORAL | 0 refills | Status: DC | PRN

## 2019-01-29 MED ORDER — OXYCODONE-ACETAMINOPHEN 5-325 MG PO TABS: 1.0000 | ORAL_TABLET | ORAL | 0 refills | Status: AC | PRN

## 2019-01-29 NOTE — ED Triage Notes (Addendum)
Pt c/o abd pain x 2 days with in ETOh and Stress , HX pancreatitis

## 2019-01-29 NOTE — ED Provider Notes (Signed)
[facility] EMERGENCY DEPARTMENT Provider Note   CSN: [id number] Arrival date & time: 01/29/19  1617     History   Chief Complaint Chief Complaint  Patient presents with  . Abdominal Pain    HPI Mallory Fernandez is a 43 y.o. female with Hx of HTN and prior episode of alcohol induced acute pancreatitis presenting with two days of left upper quadrant pain radiating to mid back after eating chicken. She reports three week history of heavy alcohol use. She has associated nausea but no vomiting. She denies any fevers, chills, chest pain, shortness of breath, headache, dizziness, urinary symptoms or change in bowel movements.   Past Medical History:  Diagnosis Date  . Hypertension   . Tendonitis of shoulder 2009   bil shoulders     Patient Active Problem List   Diagnosis Date Noted  . Macrocytic anemia 03/29/2017  . Alcoholic pancreatitis 123456  . Hypokalemia 03/28/2017  . Alcohol use 03/28/2017  . S/P hysterectomy 04/23/2013    Past Surgical History:  Procedure Laterality Date  . ABDOMINAL HYSTERECTOMY    . BILATERAL SALPINGECTOMY Bilateral 04/23/2013   Procedure: BILATERAL SALPINGECTOMY;  Surgeon: Marvene Staff, MD;  Location: [facility];  Service: Gynecology;  Laterality: Bilateral;  . TUBAL LIGATION       OB History   No obstetric history on file.      Home Medications    Prior to Admission medications   Medication Sig Start Date End Date Taking? Authorizing Provider  aspirin-sod bicarb-citric acid (ALKA-SELTZER) 325 MG TBEF tablet Take 325 mg by mouth every 8 (eight) hours as needed (heartburn).    [provider]  bismuth subsalicylate (PEPTO BISMOL) 262 MG/15ML suspension Take 30 mLs by mouth every 4 (four) hours as needed for indigestion.    [provider]  Multiple Vitamin (MULITIVITAMIN WITH MINERALS) TABS Take 1 tablet by mouth 2 (two) times daily. Mega Woman Multivitamin from [facility]    [provider]   Olmesartan-Amlodipine-HCTZ (TRIBENZOR) 20-5-12.5 MG TABS Take by mouth.    [provider]  Omega-3 Fatty Acids (FISH OIL) 1000 MG CAPS Take 1 capsule by mouth daily.    [provider]  valACYclovir (VALTREX) 500 MG tablet Take 500 mg by mouth 2 (two) times daily as needed (for flare ups). Takes 500 mg twice a day for 3 days when she has flare ups 03/26/17   [provider]    Family History History reviewed. No pertinent family history.  Social History Social History   Tobacco Use  . Smoking status: Former Research scientist (life sciences)  . Smokeless tobacco: Never Used  Substance Use Topics  . Alcohol use: Yes    Alcohol/week: 2.0 standard drinks    Types: 2 Cans of beer per week  . Drug use: No     Allergies   Patient has no known allergies.   Review of Systems Review of Systems  Constitutional: Negative for chills, fatigue and fever.  HENT: Negative.   Eyes: Negative for photophobia and visual disturbance.  Respiratory: Negative for shortness of breath and wheezing.   Cardiovascular: Negative for chest pain and palpitations.  Gastrointestinal: Positive for abdominal pain and nausea. Negative for abdominal distention, blood in stool, constipation, diarrhea and vomiting.  Endocrine: Negative.   Genitourinary: Negative for decreased urine volume, dysuria and flank pain.  Musculoskeletal: Negative.   Skin: Negative.   Allergic/Immunologic: Negative.   Neurological: Negative for dizziness, seizures, light-headedness, numbness and headaches.  Hematological: Negative.   Psychiatric/Behavioral: Negative  for agitation, confusion, decreased concentration and suicidal ideas. The patient is not nervous/anxious.     Physical Exam Updated Vital Signs BP 109/63 (BP Location: Left Arm)   Pulse 88   Temp 98 F (36.7 C)   Resp 16   Ht 5\' 8"  (1.727 m)   Wt 68 kg   LMP 03/31/2013   SpO2 100%   BMI 22.81 kg/m   Physical Exam Vitals signs reviewed.  Constitutional:       General: She is in acute distress.     Appearance: She is well-developed. She is not ill-appearing.  Cardiovascular:     Rate and Rhythm: Normal rate and regular rhythm.     Heart sounds: Normal heart sounds. No murmur. No friction rub. No gallop.   Pulmonary:     Effort: Pulmonary effort is normal. No respiratory distress.     Breath sounds: Normal breath sounds. No wheezing or rales.  Abdominal:     General: Abdomen is flat. Bowel sounds are normal. There is no distension.     Palpations: Abdomen is soft. There is no shifting dullness.     Tenderness: There is abdominal tenderness in the epigastric area and left upper quadrant. There is guarding. There is no right CVA tenderness, left CVA tenderness or rebound.  Skin:    General: Skin is warm and dry.     Capillary Refill: Capillary refill takes less than 2 seconds.  Neurological:     General: No focal deficit present.     Mental Status: She is alert and oriented to person, place, and time.     Motor: No weakness.  Psychiatric:        Mood and Affect: Mood normal.        Behavior: Behavior normal.    ED Treatments / Results  Labs (all labs ordered are listed, but only abnormal results are displayed) Labs Reviewed  CBC WITH DIFFERENTIAL/PLATELET - Abnormal; Notable for the following components:      Result Value   RBC 3.76 (*)    MCV 102.1 (*)    MCH 34.8 (*)    All other components within normal limits  COMPREHENSIVE METABOLIC PANEL - Abnormal; Notable for the following components:   Sodium 132 (*)    Potassium 3.2 (*)    Chloride 88 (*)    Glucose, Bld 106 (*)    AST 71 (*)    ALT 63 (*)    Anion gap 18 (*)    All other components within normal limits  LIPASE, BLOOD - Abnormal; Notable for the following components:   Lipase 797 (*)    All other components within normal limits  URINALYSIS, ROUTINE W REFLEX MICROSCOPIC - Abnormal; Notable for the following components:   pH 8.5 (*)    Ketones, ur 15 (*)    All other  components within normal limits  LACTIC ACID, PLASMA    EKG None  Radiology No results found.  Procedures Procedures (including critical care time)  Medications Ordered in ED Medications  lactated ringers bolus 1,000 mL (1,000 mLs Intravenous New Bag/Given 01/29/19 1916)  HYDROmorphone (DILAUDID) injection 1 mg (1 mg Intravenous Given 01/29/19 1910)  ondansetron (ZOFRAN) injection 4 mg (4 mg Intravenous Given 01/29/19 1910)     Initial Impression / Assessment and Plan / ED Course  I have reviewed the triage vital signs and the nursing notes.  Pertinent labs & imaging results that were available during my care of the patient were reviewed by me and  considered in my medical decision making (see chart for details).  Patient is a 43yr old female with prior episode of alcohol induced acute pancreatitis two years ago presenting to the ED with left upper quadrant pain radiating to the back for two days duration. Pain is sharp in nature and patient rates the pain as a 12 out of 10.  Patient endorses that the pain started yesterday suddenly after she ate chicken.  She also endorses that she has been heavily drinking for the past 3 weeks due to increased stress.  Patient reports that this is similar to her previous episode of alcohol induced acute pancreatitis.  Patient also endorses nausea but denies any vomiting.  Patient denies any fevers, chills, chest pain, shortness of breath, shoulder pain, or diarrhea.  In the ED, patient is afebrile. On examination, patient has tenderness to palpation of left upper quadrant and epigastric region with guarding. Her lipase is elevated consistent with acute pancreatitis but she does not have a leukocytosis on labs. Her lactic acid is not elevated. Patient given IVF, pain control and zofran for nausea with improvement in symptoms.  Patient to be discharged to home with pain control and instructions for hydration and medications for pain control and nausea.  Patient instructed to have liquid diet and advance as tolerated. Patient instructed to follow up with PCP in a few days. Strict return precautions provided. Patient and significant other at bedside express understanding and agree to the plan.    Final Clinical Impressions(s) / ED Diagnoses   Final diagnoses:  None    ED Discharge Orders    None       Harvie Heck, MD 01/29/19 2042    Deno Etienne, DO 01/29/19 2116

## 2019-01-29 NOTE — ED Notes (Signed)
ED Provider at bedside. 

## 2019-01-29 NOTE — Discharge Instructions (Addendum)
Mallory Fernandez,   You were evaluated in the ED today for abdominal pain consistent with acute pancreatitis. Over the next few days, ensure adequate hydration. Take the zofran as needed for vomiting and pain medicine as needed for pain. If your symptoms worsen, you develop a fever, are unable to keep anything down, or have severe abdominal pain and distension, please seek emergent medical care.   Thank you

## 2019-01-29 NOTE — ED Notes (Signed)
Pt states hx of pancreatitis 2 years ago, had stopped drinking. Began drinking 2x weeks ago due to too many stressors, admits to ~4 tall coors lite a day when drinking.

## 2019-07-07 ENCOUNTER — Telehealth: Payer: Self-pay | Admitting: *Deleted

## 2019-07-07 NOTE — Telephone Encounter (Signed)
Received urgent request for HTN CLINIC referral from Dr Thamas Jaegers number listed, patients son gave me patients number [phone], left message to call  Did ask son to have her call if she had not spoken with me when he speaks to her

## 2019-07-08 NOTE — Telephone Encounter (Signed)
Offered appointment for tomorrow, patient could not come. Patient scheduled for 07/13/2019  Patient will bring her blood pressure machine to visit

## 2019-07-08 NOTE — Telephone Encounter (Signed)
Pt was calling back returning Melinda's call about a HTN clinic referral. Please call the pt at [phone]

## 2019-07-13 ENCOUNTER — Other Ambulatory Visit: Payer: Self-pay

## 2019-07-13 ENCOUNTER — Ambulatory Visit (INDEPENDENT_AMBULATORY_CARE_PROVIDER_SITE_OTHER): Payer: 59 | Admitting: Cardiovascular Disease

## 2019-07-13 ENCOUNTER — Encounter: Payer: Self-pay | Admitting: Cardiovascular Disease

## 2019-07-13 VITALS — BP 170/90 | HR 60 | Ht 68.0 in | Wt 147.0 lb

## 2019-07-13 DIAGNOSIS — I773 Arterial fibromuscular dysplasia: Secondary | ICD-10-CM | POA: Diagnosis not present

## 2019-07-13 DIAGNOSIS — F1090 Alcohol use, unspecified, uncomplicated: Secondary | ICD-10-CM

## 2019-07-13 DIAGNOSIS — Z7289 Other problems related to lifestyle: Secondary | ICD-10-CM

## 2019-07-13 DIAGNOSIS — I10 Essential (primary) hypertension: Secondary | ICD-10-CM | POA: Diagnosis not present

## 2019-07-13 DIAGNOSIS — Z789 Other specified health status: Secondary | ICD-10-CM

## 2019-07-13 DIAGNOSIS — I2693 Single subsegmental pulmonary embolism without acute cor pulmonale: Secondary | ICD-10-CM | POA: Diagnosis not present

## 2019-07-13 DIAGNOSIS — I2699 Other pulmonary embolism without acute cor pulmonale: Secondary | ICD-10-CM

## 2019-07-13 HISTORY — DX: Arterial fibromuscular dysplasia: I77.3

## 2019-07-13 HISTORY — DX: Other pulmonary embolism without acute cor pulmonale: I26.99

## 2019-07-13 HISTORY — DX: Essential (primary) hypertension: I10

## 2019-07-13 MED ORDER — OLMESARTAN MEDOXOMIL 20 MG PO TABS: 20.0000 mg | ORAL_TABLET | Freq: Every day | ORAL | 3 refills | Status: DC

## 2019-07-13 MED ORDER — AMLODIPINE BESYLATE 5 MG PO TABS: 5.0000 mg | ORAL_TABLET | Freq: Every day | ORAL | 3 refills | Status: DC

## 2019-07-13 MED ORDER — HYDROCHLOROTHIAZIDE 12.5 MG PO TABS: 12.5000 mg | ORAL_TABLET | Freq: Every day | ORAL | 3 refills | Status: DC

## 2019-07-13 NOTE — Addendum Note (Signed)
Addended by: Alvina Filbert B on: 07/13/2019 05:05 PM   Modules accepted: Orders

## 2019-07-13 NOTE — Progress Notes (Signed)
Hypertension Clinic Initial Assessment:    Date:  07/13/2019   ID:  Mallory Fernandez, DOB Sep 19, 1975, MRN [id number]  PCP:  Benito Mccreedy, MD  Cardiologist:  No primary care provider on file.  Nephrologist:  Referring MD: Dr. Garwin Brothers  CC: Hypertension  History of Present Illness:    Mallory Fernandez is a 44 y.o. female with a hx of chronic pulmonary embolism, hypertension and alcololism here to establish care in the hypertension clinic.  She reports having hypertension for approximately 3 years.  She previously followed up with Dr. Criss Rosales and her BP was well-controlled on amlodipine, olmesartan and HCTZ.  She switched care to Dr. Vista Lawman.  In general her BP was well-controlled around [id number] systolic.  On 2/22 she had an episode of syncope.  Her BP was 60/40.  She thought her machine was reading incorrectly.  When she stood up she passed out and hit the floor.  She had a significant laceration to her had and lip.  She went to urgent care and they referred her to [facility]. In the ED she had a chest CT that revealed an old PE that appeared chronic.  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  There was also a irregular narrowing with a beaded appearance in the right greater than left renal arteries concerning for fibromuscular dysplasia.  She notes that she was not eating or drinking very much at the time because she was very stressed.  She was told that she had to move out of her home within 30 days.  She also lost her dog and is stressed because her son recently moved out, so she is all alone.  She struggled with EtOH in the past but hasn't had any in the last month.  She also quit smoking.    After leaving the hospital she was instructed to reduce herTribenzor in half.  When she did this her blood pressure was still running a little low but better.  She saw Dr. Matthias Hughs office and was switched to losartan.  Her BP  was then running above goal.  She followed up with Dr. Garwin Brothers on 3/1 and decided to stop taking all of her medications.  Her BP has now been elevated.  Overall she has been feeling well.  She can tell when her BP is running high.  She walks her dog daily and has no exertional chest pain or shortness of breath.  She has mild lower extremity edema but denies orthopnea or PND.  She drinks a gallon of water daily.  She tries to limit her sodium intake and mostly eats at home.  She was admitted 01/2019 with acute alcoholic pancreatitis.  She notes that she lost 20 pounds in the last year.   Previous antihypertensives: olmesartan HCTZ Amlodipine losartan  Past Medical History:  Diagnosis Date  . Hypertension   . Tendonitis of shoulder 2009   bil shoulders     Past Surgical History:  Procedure Laterality Date  . ABDOMINAL HYSTERECTOMY    . BILATERAL SALPINGECTOMY Bilateral 04/23/2013   Procedure: BILATERAL SALPINGECTOMY;  Surgeon: Marvene Staff, MD;  Location: [facility];  Service: Gynecology;  Laterality: Bilateral;  . TUBAL LIGATION      Current Medications: Current Meds  Medication Sig  . aspirin-sod bicarb-citric acid (ALKA-SELTZER) 325 MG TBEF tablet Take 325 mg by mouth every 8 (eight) hours as needed (heartburn).  . bismuth subsalicylate (PEPTO BISMOL) 262  MG/15ML suspension Take 30 mLs by mouth every 4 (four) hours as needed for indigestion.  . Multiple Vitamin (MULITIVITAMIN WITH MINERALS) TABS Take 1 tablet by mouth 2 (two) times daily. Mega Woman Multivitamin from [facility]  . Omega-3 Fatty Acids (FISH OIL) 1000 MG CAPS Take 1 capsule by mouth daily.  . valACYclovir (VALTREX) 500 MG tablet Take 500 mg by mouth 2 (two) times daily as needed (for flare ups). Takes 500 mg twice a day for 3 days when she has flare ups  . [DISCONTINUED] Olmesartan-Amlodipine-HCTZ (TRIBENZOR) 20-5-12.5 MG TABS Take by mouth.  . [DISCONTINUED] ondansetron (ZOFRAN ODT) 4 MG disintegrating tablet Take 1  tablet (4 mg total) by mouth every 8 (eight) hours as needed for nausea or vomiting.     Allergies:   Patient has no known allergies.   Social History   Socioeconomic History  . Marital status: Single    Spouse name: Not on file  . Number of children: Not on file  . Years of education: Not on file  . Highest education level: Not on file  Occupational History  . Not on file  Tobacco Use  . Smoking status: Former Research scientist (life sciences)  . Smokeless tobacco: Never Used  Substance and Sexual Activity  . Alcohol use: Yes    Alcohol/week: 2.0 standard drinks    Types: 2 Cans of beer per week  . Drug use: No  . Sexual activity: Not on file  Other Topics Concern  . Not on file  Social History Narrative  . Not on file   Social Determinants of Health   Financial Resource Strain:   . Difficulty of Paying Living Expenses: Not on file  Food Insecurity:   . Worried About Charity fundraiser in the Last Year: Not on file  . Ran Out of Food in the Last Year: Not on file  Transportation Needs:   . Lack of Transportation (Medical): Not on file  . Lack of Transportation (Non-Medical): Not on file  Physical Activity:   . Days of Exercise per Week: Not on file  . Minutes of Exercise per Session: Not on file  Stress:   . Feeling of Stress : Not on file  Social Connections:   . Frequency of Communication with Friends and Family: Not on file  . Frequency of Social Gatherings with Friends and Family: Not on file  . Attends Religious Services: Not on file  . Active Member of Clubs or Organizations: Not on file  . Attends Archivist Meetings: Not on file  . Marital Status: Not on file     Family History: The patient's  family history is not on file.  ROS:   Please see the history of present illness.     All other systems reviewed and are negative.  EKGs/Labs/Other Studies Reviewed:    EKG:  EKG is ordered today.  The ekg ordered today demonstrates sinus rhythm.  Rate 60 bpm.    Recent  Labs: 01/29/2019: ALT 63; BUN 8; Creatinine, Ser 0.92; Hemoglobin 13.1; Platelets 221; Potassium 3.2; Sodium 132   Recent Lipid Panel    Component Value Date/Time   CHOL 138 03/28/2017 0900   TRIG 65 03/28/2017 0900   HDL 73 03/28/2017 0900   CHOLHDL 1.9 03/28/2017 0900   VLDL 13 03/28/2017 0900   LDLCALC 52 03/28/2017 0900    Physical Exam:    VS:  BP (!) 170/90   Pulse 60   Ht 5\' 8"  (1.727 m)   Wt  147 lb (66.7 kg)   LMP 03/31/2013   SpO2 95%   BMI 22.35 kg/m     Wt Readings from Last 3 Encounters:  07/13/19 147 lb (66.7 kg)  01/29/19 150 lb (68 kg)  03/28/17 140 lb (63.5 kg)    VS:  BP (!) 170/90   Pulse 60   Ht 5\' 8"  (1.727 m)   Wt 147 lb (66.7 kg)   LMP 03/31/2013   SpO2 95%   BMI 22.35 kg/m  , BMI Body mass index is 22.35 kg/m. GENERAL:  Well appearing HEENT: Pupils equal round and reactive, fundi not visualized, oral mucosa unremarkable NECK:  No jugular venous distention, waveform within normal limits, carotid upstroke brisk and symmetric, no bruits LUNGS:  Clear to auscultation bilaterally HEART:  RRR.  PMI not displaced or sustained,S1 and S2 within normal limits, no S3, no S4, no clicks, no rubs, no murmurs ABD:  Flat, positive bowel sounds normal in frequency in pitch, no bruits, no rebound, no guarding, no midline pulsatile mass, no hepatomegaly, no splenomegaly EXT:  2 plus pulses throughout, no edema, no cyanosis no clubbing SKIN:  No rashes no nodules NEURO:  Cranial nerves II through XII grossly intact, motor grossly intact throughout PSYCH:  Cognitively intact, oriented to person place and time   ASSESSMENT:    No diagnosis found.  PLAN:    # Essential hypertension: BP is now elevated in the setting of being off all her medications.  She had an episode of syncope that occurred in the setting of not eating or drinking well.  She was under a lot of stress at the time.  I suspect that this led to intravascular volume depletion.  She also probably  does not need as much medication after losing 20 pounds in the last year.  Lately she has not been taking anything.  Given that she likely has fibromuscular dysplasia, we will restart her on losartan.  I suspect that she will need more than this.  We will stop the amlodipine and resume hydrochlorothiazide 12.5 mg daily.  She is going to track her BP twice daily.  She was encouraged to continue abstaining from EtOH.  # Fibomuscular dysplasia:  Renal arteries had a "beads on a string appearance on CT 05/2019.  We will get a CT-A of the head through pelvis to assess for aneurysm and dysplasia.  Her father reportedly had an abdominal aneurysm requiring emergency surgery.   # Chronic PE: Resolving on chest CT-a 06/2019.  She was not started on anticoagulation.  She has no symptoms of RV failure or shortness of breath.  If persistent on follow up imaging, consider echo to rule out pulmonary hypertension.    Disposition:    FU with MD/PharmD in 1 month    Medication Adjustments/Labs and Tests Ordered: Current medicines are reviewed at length with the patient today.  Concerns regarding medicines are outlined above.  No orders of the defined types were placed in this encounter.  No orders of the defined types were placed in this encounter.    Signed, Skeet Latch, MD  07/13/2019 2:31 PM    [facility]

## 2019-07-13 NOTE — Patient Instructions (Addendum)
Medication Instructions:  START OLMESARTAN 20 MG DAILY   START HYDROCHLOROTHIAZIDE 12.5 MG DAILY   *If you need a refill on your cardiac medications before your next appointment, please call your pharmacy*  Lab Work: BMET 1 WEEK PRIOR TO CT  Testing/Procedures: CT OF HEAD, NECK, CHEST, AND PELVIS THE OFFICE WILL CALL YOU TO SCHEDULE   Follow-Up: At [facility], you and your health needs are our priority.  As part of our continuing mission to provide you with exceptional heart care, we have created designated Provider Care Teams.  These Care Teams include your primary Cardiologist (physician) and Advanced Practice Providers (APPs -  Physician Assistants and Nurse Practitioners) who all work together to provide you with the care you need, when you need it.  We recommend signing up for the patient portal called "MyChart".  Sign up information is provided on this After Visit Summary.  MyChart is used to connect with patients for Virtual Visits (Telemedicine).  Patients are able to view lab/test results, encounter notes, upcoming appointments, etc.  Non-urgent messages can be sent to your provider as well.   To learn more about what you can do with MyChart, go to [email or web address].    Your next appointment:    DR [facility] ON 08/11/2019 AT 1:00 PM

## 2019-07-14 ENCOUNTER — Telehealth: Payer: Self-pay | Admitting: Cardiovascular Disease

## 2019-07-14 NOTE — Telephone Encounter (Signed)
Spoke with patient regarding appointment for CTA-head, neck chest, abd and pelvis scheduled Friday 07/24/19 at 2:30 pm at [facility]--[address]---Phone # [phone]. Arrival time is 2:00pm--liquids only 4 hour prior to exam.  Patient voiced her understanding.

## 2019-07-16 DIAGNOSIS — Z8616 Personal history of COVID-19: Secondary | ICD-10-CM | POA: Insufficient documentation

## 2019-07-21 LAB — BASIC METABOLIC PANEL
BUN/Creatinine Ratio: 12 (ref 9–23)
BUN: 11 mg/dL (ref 6–24)
CO2: 25 mmol/L (ref 20–29)
Calcium: 9.5 mg/dL (ref 8.7–10.2)
Chloride: 101 mmol/L (ref 96–106)
Creatinine, Ser: 0.89 mg/dL (ref 0.57–1.00)
GFR calc Af Amer: 91 mL/min/{1.73_m2} (ref >59)
GFR calc non Af Amer: 79 mL/min/{1.73_m2} (ref >59)
Glucose: 80 mg/dL (ref 65–99)
Potassium: 4 mmol/L (ref 3.5–5.2)
Sodium: 141 mmol/L (ref 134–144)

## 2019-07-24 ENCOUNTER — Other Ambulatory Visit: Payer: 59

## 2019-07-24 ENCOUNTER — Inpatient Hospital Stay: Admission: RE | Admit: 2019-07-24 | Payer: 59 | Source: Ambulatory Visit

## 2019-07-30 ENCOUNTER — Telehealth: Payer: Self-pay | Admitting: *Deleted

## 2019-07-30 NOTE — Telephone Encounter (Signed)
Patient cancelled CT's order by Dr Oval Linsey at 07/13/2019 visit secondary to insurance coverage/deductable and will call back to reschedule per appointment desk

## 2019-07-31 NOTE — Telephone Encounter (Signed)
Does she just need prior authorization?  She needs this study.

## 2019-07-31 NOTE — Telephone Encounter (Signed)
Advised Dr Oval Linsey it is a deductible issue, no further recommendations at this time

## 2019-08-11 ENCOUNTER — Ambulatory Visit: Payer: 59 | Admitting: Cardiovascular Disease

## 2019-08-13 ENCOUNTER — Other Ambulatory Visit: Payer: Self-pay

## 2019-08-13 ENCOUNTER — Ambulatory Visit (INDEPENDENT_AMBULATORY_CARE_PROVIDER_SITE_OTHER)
Admission: RE | Admit: 2019-08-13 | Discharge: 2019-08-13 | Disposition: A | Discharge status: Home / Self Care | Payer: 59 | Source: Ambulatory Visit | Attending: Cardiovascular Disease | Admitting: Cardiovascular Disease

## 2019-08-13 DIAGNOSIS — I10 Essential (primary) hypertension: Secondary | ICD-10-CM

## 2019-08-13 DIAGNOSIS — I773 Arterial fibromuscular dysplasia: Secondary | ICD-10-CM | POA: Diagnosis not present

## 2019-08-13 MED ORDER — IOHEXOL 350 MG/ML SOLN
60.0000 mL | Freq: Once | INTRAVENOUS | Status: AC | PRN
  Administered 2019-08-13: 11:00:00 60 mL via INTRAVENOUS

## 2019-08-13 MED ORDER — IOHEXOL 350 MG/ML SOLN
80.0000 mL | Freq: Once | INTRAVENOUS | Status: AC | PRN
  Administered 2019-08-13: 11:00:00 80 mL via INTRAVENOUS

## 2019-08-17 ENCOUNTER — Telehealth: Payer: Self-pay | Admitting: *Deleted

## 2019-08-17 NOTE — Telephone Encounter (Signed)
Advised patient, verbalized understanding  

## 2019-08-17 NOTE — Telephone Encounter (Signed)
Results reported.

## 2019-08-17 NOTE — Telephone Encounter (Signed)
-----   Message from Skeet Latch, MD sent at 08/17/2019  2:51 PM EDT ----- CT shows narrowing and fibromuscular dysplasia of the renal artery, which we already knew about.  There is no evidence of fibromuscular dysplasia in the other arteries.  There is very mild plaque in the carotid arteries.  Continue with current management.

## 2019-08-17 NOTE — Telephone Encounter (Signed)
Patient called wanting CT resutls Will forward to Dr Oval Linsey for review

## 2020-02-26 ENCOUNTER — Encounter (HOSPITAL_COMMUNITY): Payer: Self-pay | Admitting: *Deleted

## 2020-02-26 ENCOUNTER — Emergency Department (HOSPITAL_COMMUNITY)
Admission: EM | Admit: 2020-02-26 | Discharge: 2020-02-26 | Disposition: A | Discharge status: Home / Self Care | Payer: No Typology Code available for payment source | Attending: Emergency Medicine | Admitting: Emergency Medicine

## 2020-02-26 ENCOUNTER — Other Ambulatory Visit: Payer: Self-pay

## 2020-02-26 DIAGNOSIS — R109 Unspecified abdominal pain: Secondary | ICD-10-CM | POA: Diagnosis present

## 2020-02-26 DIAGNOSIS — Z79899 Other long term (current) drug therapy: Secondary | ICD-10-CM | POA: Diagnosis not present

## 2020-02-26 DIAGNOSIS — E876 Hypokalemia: Secondary | ICD-10-CM | POA: Diagnosis not present

## 2020-02-26 DIAGNOSIS — I10 Essential (primary) hypertension: Secondary | ICD-10-CM | POA: Insufficient documentation

## 2020-02-26 DIAGNOSIS — Z87891 Personal history of nicotine dependence: Secondary | ICD-10-CM | POA: Insufficient documentation

## 2020-02-26 DIAGNOSIS — K852 Alcohol induced acute pancreatitis without necrosis or infection: Secondary | ICD-10-CM

## 2020-02-26 LAB — CBC WITH DIFFERENTIAL/PLATELET
Abs Immature Granulocytes: 0.01 10*3/uL (ref 0.00–0.07)
Basophils Absolute: 0.1 10*3/uL (ref 0.0–0.1)
Basophils Relative: 2 %
Eosinophils Absolute: 0.1 10*3/uL (ref 0.0–0.5)
Eosinophils Relative: 2 %
HCT: 30.7 % — ABNORMAL LOW (ref 36.0–46.0)
Hemoglobin: 10.8 g/dL — ABNORMAL LOW (ref 12.0–15.0)
Immature Granulocytes: 0 %
Lymphocytes Relative: 27 %
Lymphs Abs: 1 10*3/uL (ref 0.7–4.0)
MCH: 35.9 pg — ABNORMAL HIGH (ref 26.0–34.0)
MCHC: 35.2 g/dL (ref 30.0–36.0)
MCV: 102 fL — ABNORMAL HIGH (ref 80.0–100.0)
Monocytes Absolute: 0.4 10*3/uL (ref 0.1–1.0)
Monocytes Relative: 10 %
Neutro Abs: 2.1 10*3/uL (ref 1.7–7.7)
Neutrophils Relative %: 59 %
Platelets: 185 10*3/uL (ref 150–400)
RBC: 3.01 MIL/uL — ABNORMAL LOW (ref 3.87–5.11)
RDW: 12.6 % (ref 11.5–15.5)
WBC: 3.6 10*3/uL — ABNORMAL LOW (ref 4.0–10.5)
nRBC: 0 % (ref 0.0–0.2)

## 2020-02-26 LAB — COMPREHENSIVE METABOLIC PANEL
ALT: 167 U/L — ABNORMAL HIGH (ref 0–44)
AST: 339 U/L — ABNORMAL HIGH (ref 15–41)
Albumin: 3.9 g/dL (ref 3.5–5.0)
Alkaline Phosphatase: 73 U/L (ref 38–126)
Anion gap: 12 (ref 5–15)
BUN: <5 mg/dL — ABNORMAL LOW (ref 6–20)
CO2: 30 mmol/L (ref 22–32)
Calcium: 9.6 mg/dL (ref 8.9–10.3)
Chloride: 96 mmol/L — ABNORMAL LOW (ref 98–111)
Creatinine, Ser: 0.92 mg/dL (ref 0.44–1.00)
GFR, Estimated: >60 mL/min (ref >60)
Glucose, Bld: 106 mg/dL — ABNORMAL HIGH (ref 70–99)
Potassium: 3.1 mmol/L — ABNORMAL LOW (ref 3.5–5.1)
Sodium: 138 mmol/L (ref 135–145)
Total Bilirubin: 0.8 mg/dL (ref 0.3–1.2)
Total Protein: 7.1 g/dL (ref 6.5–8.1)

## 2020-02-26 LAB — PROTIME-INR
INR: 0.9 (ref 0.8–1.2)
Prothrombin Time: 12 seconds (ref 11.4–15.2)

## 2020-02-26 LAB — APTT: aPTT: 25 seconds (ref 24–36)

## 2020-02-26 LAB — LIPASE, BLOOD: Lipase: 56 U/L — ABNORMAL HIGH (ref 11–51)

## 2020-02-26 MED ORDER — POTASSIUM CHLORIDE CRYS ER 20 MEQ PO TBCR
40.0000 meq | EXTENDED_RELEASE_TABLET | Freq: Once | ORAL | Status: AC
  Administered 2020-02-26: 40 meq via ORAL
  Filled 2020-02-26: qty 2

## 2020-02-26 MED ORDER — SODIUM CHLORIDE 0.9 % IV SOLN: INTRAVENOUS | Status: DC

## 2020-02-26 NOTE — ED Triage Notes (Signed)
Sent from Dr Cousin's office due to abd labs. ? Pancreatitis, labs have been faxed and she has spoken with Dr Tomi Bamberger

## 2020-02-26 NOTE — ED Provider Notes (Signed)
[facility] DEPT Provider Note   CSN: [id number] Arrival date & time: 02/26/20  1202     History Chief Complaint  Patient presents with  . Abnormal Lab    Mallory Fernandez is a 44 y.o. female.  44 year old female presents with abdominal discomfort times several days.  Patient has no history of pancreatitis and admits to drinking alcohol recently.  Patient has been on clear liquid diet since several days which is made her symptoms improved.  She last 24 hours has had no fever, vomiting.  Denies any black or bloody stools.  Saw her doctor yesterday and had blood work done which showed elevated LFTs as well as lipase and sent here for further management.        Past Medical History:  Diagnosis Date  . Arterial fibromuscular dysplasia ([place]) 07/13/2019  . Essential hypertension 07/13/2019  . Hypertension   . Pulmonary embolism ([place]) 07/13/2019   Chronic PE noted on chest CT 06/2019  . Tendonitis of shoulder 2009   bil shoulders     Patient Active Problem List   Diagnosis Date Noted  . Essential hypertension 07/13/2019  . Arterial fibromuscular dysplasia ([place]) 07/13/2019  . Pulmonary embolism ([place]) 07/13/2019  . Macrocytic anemia 03/29/2017  . Alcoholic pancreatitis 44/81/8563  . Hypokalemia 03/28/2017  . Alcohol use 03/28/2017  . S/P hysterectomy 04/23/2013    Past Surgical History:  Procedure Laterality Date  . ABDOMINAL HYSTERECTOMY    . BILATERAL SALPINGECTOMY Bilateral 04/23/2013   Procedure: BILATERAL SALPINGECTOMY;  Surgeon: Marvene Staff, MD;  Location: [facility];  Service: Gynecology;  Laterality: Bilateral;  . TUBAL LIGATION       OB History   No obstetric history on file.     Family History  Problem Relation Age of Onset  . Hypertension Mother   . Aortic aneurysm Father   . Hypertension Sister   . Breast cancer Maternal Grandmother   . Diabetes Maternal Grandfather   . Uterine cancer Paternal Grandmother   .  Hypertension Sister     Social History   Tobacco Use  . Smoking status: Former Research scientist (life sciences)  . Smokeless tobacco: Never Used  Substance Use Topics  . Alcohol use: Yes    Alcohol/week: 2.0 standard drinks    Types: 2 Cans of beer per week  . Drug use: No    Home Medications Prior to Admission medications   Medication Sig Start Date End Date Taking? Authorizing Provider  aspirin-sod bicarb-citric acid (ALKA-SELTZER) 325 MG TBEF tablet Take 325 mg by mouth every 8 (eight) hours as needed (heartburn).    [provider]  bismuth subsalicylate (PEPTO BISMOL) 262 MG/15ML suspension Take 30 mLs by mouth every 4 (four) hours as needed for indigestion.    [provider]  hydrochlorothiazide (HYDRODIURIL) 12.5 MG tablet Take 1 tablet (12.5 mg total) by mouth daily. 07/13/19   Skeet Latch, MD  Multiple Vitamin (MULITIVITAMIN WITH MINERALS) TABS Take 1 tablet by mouth 2 (two) times daily. Mega Woman Multivitamin from [facility]    [provider]  olmesartan (BENICAR) 20 MG tablet Take 1 tablet (20 mg total) by mouth daily. 07/13/19   Skeet Latch, MD  Omega-3 Fatty Acids (FISH OIL) 1000 MG CAPS Take 1 capsule by mouth daily.    [provider]  valACYclovir (VALTREX) 500 MG tablet Take 500 mg by mouth 2 (two) times daily as needed (for flare ups). Takes 500 mg twice a day for 3 days when she has flare ups  03/26/17   [provider]    Allergies    Patient has no known allergies.  Review of Systems   Review of Systems  All other systems reviewed and are negative.   Physical Exam Updated Vital Signs BP (!) 146/107 (BP Location: Right Arm)   Pulse 69   Temp 98.1 F (36.7 C) (Oral)   Resp 15   Ht 1.727 m (5\' 8" )   Wt 61.2 kg   LMP 03/31/2013   SpO2 100%   BMI 20.53 kg/m   Physical Exam Vitals and nursing note reviewed.  Constitutional:      General: She is not in acute distress.    Appearance: Normal appearance. She is well-developed. She  is not toxic-appearing.  HENT:     Head: Normocephalic and atraumatic.  Eyes:     General: Lids are normal.     Conjunctiva/sclera: Conjunctivae normal.     Pupils: Pupils are equal, round, and reactive to light.  Neck:     Thyroid: No thyroid mass.     Trachea: No tracheal deviation.  Cardiovascular:     Rate and Rhythm: Normal rate and regular rhythm.     Heart sounds: Normal heart sounds. No murmur heard.  No gallop.   Pulmonary:     Effort: Pulmonary effort is normal. No respiratory distress.     Breath sounds: Normal breath sounds. No stridor. No decreased breath sounds, wheezing, rhonchi or rales.  Abdominal:     General: Bowel sounds are normal. There is no distension.     Palpations: Abdomen is soft.     Tenderness: There is no abdominal tenderness. There is no rebound.  Musculoskeletal:        General: No tenderness. Normal range of motion.     Cervical back: Normal range of motion and neck supple.  Skin:    General: Skin is warm and dry.     Findings: No abrasion or rash.  Neurological:     Mental Status: She is alert and oriented to person, place, and time.     GCS: GCS eye subscore is 4. GCS verbal subscore is 5. GCS motor subscore is 6.     Cranial Nerves: No cranial nerve deficit.     Sensory: No sensory deficit.  Psychiatric:        Speech: Speech normal.        Behavior: Behavior normal.     ED Results / Procedures / Treatments   Labs (all labs ordered are listed, but only abnormal results are displayed) Labs Reviewed  CBC WITH DIFFERENTIAL/PLATELET  COMPREHENSIVE METABOLIC PANEL  LIPASE, BLOOD  PROTIME-INR  APTT    EKG None  Radiology No results found.  Procedures Procedures (including critical care time)  Medications Ordered in ED Medications  0.9 %  sodium chloride infusion (has no administration in time range)    ED Course  I have reviewed the triage vital signs and the nursing notes.  Pertinent labs & imaging results that were  available during my care of the patient were reviewed by me and considered in my medical decision making (see chart for details).    MDM Rules/Calculators/A&P                          Patient's labs are reassuring here as her lipase is only slightly elevated.  LFTs are also slightly elevated but consistent with alcohol use.  Mild hypokalemia noted at 3.1 and given oral potassium.  She has no abdominal pain at this time.  Will encourage patient to continue with her current treatment regimen and abstain from alcohol Final Clinical Impression(s) / ED Diagnoses Final diagnoses:  None    Rx / DC Orders ED Discharge Orders    None       Lacretia Leigh, MD 02/26/20 1502

## 2020-02-26 NOTE — Discharge Instructions (Signed)
Your liver function tests are elevated today and likely related to your alcohol use.  Please avoid alcohol use and follow-up with your doctor in 1 to 2 weeks for repeat liver function test

## 2020-05-31 ENCOUNTER — Other Ambulatory Visit: Payer: Self-pay | Admitting: Cardiovascular Disease

## 2020-07-08 ENCOUNTER — Ambulatory Visit: Payer: No Typology Code available for payment source | Admitting: Cardiovascular Disease

## 2020-08-27 ENCOUNTER — Emergency Department (INDEPENDENT_AMBULATORY_CARE_PROVIDER_SITE_OTHER)
Admission: EM | Admit: 2020-08-27 | Discharge: 2020-08-27 | Disposition: A | Discharge status: Other Acute Inpatient Hospital | Payer: No Typology Code available for payment source | Source: Home / Self Care

## 2020-08-27 ENCOUNTER — Other Ambulatory Visit: Payer: Self-pay

## 2020-08-27 ENCOUNTER — Encounter: Payer: Self-pay | Admitting: Emergency Medicine

## 2020-08-27 DIAGNOSIS — R1030 Lower abdominal pain, unspecified: Secondary | ICD-10-CM

## 2020-08-27 LAB — POCT URINALYSIS DIP (MANUAL ENTRY)
Bilirubin, UA: NEGATIVE
Blood, UA: NEGATIVE
Glucose, UA: NEGATIVE mg/dL
Ketones, POC UA: NEGATIVE mg/dL
Leukocytes, UA: NEGATIVE
Nitrite, UA: NEGATIVE
Protein Ur, POC: NEGATIVE mg/dL
Spec Grav, UA: 1.02 (ref 1.010–1.025)
Urobilinogen, UA: 0.2 E.U./dL
pH, UA: 7 (ref 5.0–8.0)

## 2020-08-27 MED ORDER — ONDANSETRON HCL 4 MG/2ML IJ SOLN: 4.0000 mg | Freq: Once | INTRAMUSCULAR | Status: DC

## 2020-08-27 MED ORDER — KETOROLAC TROMETHAMINE 30 MG/ML IJ SOLN
30.0000 mg | Freq: Once | INTRAMUSCULAR | Status: AC
  Administered 2020-08-27: 30 mg via INTRAMUSCULAR

## 2020-08-27 NOTE — ED Provider Notes (Signed)
[facility]    CSN: [id number] Arrival date & time: 08/27/20  1320      History   Chief Complaint Chief Complaint  Patient presents with  . Left Sided Pain    HPI GWENDLOYN Fernandez is a 45 y.o. female.   HPI   Patient states that she was well yesterday.  She slept well.  She woke up this morning with some pain in her left lower quadrant.  Throughout the day it is progressively worsened.  She states he is never had pain like this before.  No history of kidney stones or kidney problems.  She has had a hysterectomy but still has ovaries.  She has never had a colonoscopy.  No history of colitis or colon problems.  She has had no diarrhea, no nausea no vomiting.  Past Medical History:  Diagnosis Date  . 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 Essential hypertension 07/13/2019  . Hypertension   . Pulmonary embolism ([place]) 07/13/2019   Chronic PE noted on chest CT 06/2019  . Tendonitis of shoulder 2009   bil shoulders     Patient Active Problem List   Diagnosis Date Noted  . Essential hypertension 07/13/2019  . 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 Pulmonary embolism ([place]) 07/13/2019  . Macrocytic anemia 03/29/2017  . 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 Hypokalemia 03/28/2017  . Alcohol use 03/28/2017  . S/P hysterectomy 04/23/2013    Past Surgical History:  Procedure Laterality Date  . ABDOMINAL HYSTERECTOMY    . BILATERAL SALPINGECTOMY Bilateral 04/23/2013   Procedure: BILATERAL SALPINGECTOMY;  Surgeon: Marvene Staff, MD;  Location: [facility];  Service: Gynecology;  Laterality: Bilateral;  . TUBAL LIGATION      OB History   No obstetric history on file.      Home Medications    Prior to Admission medications   Medication Sig Start Date End Date Taking? Authorizing Provider  hydrochlorothiazide (HYDRODIURIL) 12.5 MG tablet TAKE 1 TABLET BY MOUTH EVERY DAY 05/31/20  Yes Skeet Latch, MD  Multiple Vitamin  (MULITIVITAMIN WITH MINERALS) TABS Take 1 tablet by mouth 2 (two) times daily. Mega Woman Multivitamin from [facility]   Yes [provider]  olmesartan (BENICAR) 20 MG tablet TAKE 1 TABLET BY MOUTH EVERY DAY 05/31/20  Yes Skeet Latch, MD  Omega-3 Fatty Acids (FISH OIL) 1000 MG CAPS Take 1 capsule by mouth daily.   Yes [provider]  valACYclovir (VALTREX) 500 MG tablet Take 500 mg by mouth 2 (two) times daily as needed (for flare ups). Takes 500 mg twice a day for 3 days when she has flare ups 03/26/17  Yes [provider]    Family History Family History  Problem Relation Age of Onset  . Hypertension Mother   . Aortic aneurysm Father   . Hypertension Sister   . Breast cancer Maternal Grandmother   . Diabetes Maternal Grandfather   . Uterine cancer Paternal Grandmother   . Hypertension Sister     Social History Social History   Tobacco Use  . Smoking status: Former Research scientist (life sciences)  . Smokeless tobacco: Never Used  Substance Use Topics  . Alcohol use: Yes    Alcohol/week: 2.0 standard drinks    Types: 2 Cans of beer per week  . Drug use: No     Allergies   Patient has no known allergies.   Review of Systems Review of Systems See HPI  Physical Exam Triage Vital Signs ED Triage Vitals  Enc Vitals Group  BP 08/27/20 1336 135/88     Pulse Rate 08/27/20 1336 63     Resp --      Temp 08/27/20 1336 97.8 F (36.6 C)     Temp Source 08/27/20 1336 Oral     SpO2 08/27/20 1336 100 %     Weight --      Height --      Head Circumference --      Peak Flow --      Pain Score 08/27/20 1338 10     Pain Loc --      Pain Edu? --      Excl. in [place]? --    No data found.  Updated Vital Signs BP 135/88 (BP Location: Left Arm)   Pulse 63   Temp 97.8 F (36.6 C) (Oral)   LMP 03/31/2013   SpO2 100%     Physical Exam Constitutional:      General: She is not in acute distress.    Appearance: She is well-developed and normal weight. She is ill-appearing.      Comments: Patient is very uncomfortable.  Guarded movements.  Walks slightly flexed holding onto abdomen.  HENT:     Head: Normocephalic and atraumatic.     Mouth/Throat:     Comments: Mask is in place Eyes:     Conjunctiva/sclera: Conjunctivae normal.     Pupils: Pupils are equal, round, and reactive to light.  Cardiovascular:     Rate and Rhythm: Normal rate and regular rhythm.     Heart sounds: Normal heart sounds.  Pulmonary:     Effort: Pulmonary effort is normal. No respiratory distress.     Breath sounds: Normal breath sounds.  Chest:     Chest wall: No tenderness.  Abdominal:     General: Abdomen is flat. There is no distension.     Palpations: Abdomen is soft.     Tenderness: There is abdominal tenderness. There is guarding. There is no rebound.     Comments: Patient is lean.  Abdomen is flat.  There is tenderness to even moderate palpation in the left lower quadrant.  No palpable mass.  There is guarding but no rebound.  Musculoskeletal:        General: Normal range of motion.     Cervical back: Normal range of motion.  Skin:    General: Skin is warm and dry.  Neurological:     Mental Status: She is alert.  Psychiatric:        Behavior: Behavior normal.      UC Treatments / Results  Labs (all labs ordered are listed, but only abnormal results are displayed) Labs Reviewed  POCT URINALYSIS DIP (MANUAL ENTRY)    EKG   Radiology No results found.  Procedures Procedures (including critical care time)  Medications Ordered in UC Medications  ketorolac (TORADOL) 30 MG/ML injection 30 mg (30 mg Intramuscular Given 08/27/20 1430)    Initial Impression / Assessment and Plan / UC Course  I have reviewed the triage vital signs and the nursing notes.  Pertinent labs & imaging results that were available during my care of the patient were reviewed by me and considered in my medical decision making (see chart for details).     Patient has left lower  quadrant pain, unclear etiology.  Anatomically over your: The most likely culprits.  Concern for history of arterial fibromuscular dysplasia and known renal artery disease.  I gave patient a shot of Toradol and observed her for  30 minutes.  Her abdominal pain was still quite acute.  She was advised to go the emergency room for additional imaging and a higher level of care Final Clinical Impressions(s) / UC Diagnoses   Final diagnoses:  Lower abdominal pain     Discharge Instructions     Go to the emergency room, [facility] in [place] need more advanced medical care than I can provide  Acute left lower quadrant abdominal pain   ED Prescriptions    None     PDMP not reviewed this encounter.   Raylene Everts, MD 08/28/20 [phone]

## 2020-08-27 NOTE — ED Triage Notes (Signed)
Patient states that she awoke this morning with some left sided pain which has worsened over the last couple of hours.  No urinary sx's.  Patient has taken a BC powder for pain.  No nausea, vomiting or diarrhea.

## 2020-08-27 NOTE — Discharge Instructions (Addendum)
Go to the emergency room, [facility] in [place] need more advanced medical care than I can provide  Acute left lower quadrant abdominal pain

## 2020-09-26 NOTE — Progress Notes (Signed)
Cardiology Office Note:    Date:  10/04/2020   ID:  Mallory Fernandez, DOB 08-01-1975, MRN [id number]  PCP:  Maxie Better, MD  Cardiologist:  Chilton Si, MD  Electrophysiologist:  None   Referring MD: Maxie Better, MD   Chief Complaint: follow-up of hypertension   History of Present Illness:    Mallory Fernandez is a 45 y.o. 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 Duke Salvia and presents today for routine follow-up of hypertension.  Patient was referred to Dr. Duke Salvia in 07/2019 for further evaluation of hypertension.. BP previously well controlled on Tribenzor (Amlodipine, Olmesartan, and HCTZ). In 2//2021, she had a syncopal episode that occurred in the setting of hypotension. Patient had reportedly was checking her BP when it read 60/40. She thought her machine was reading incorrectly and when she stood up, she passed out and it the floor sustaining a significant laceration to her head and lip. She was seen in the [facility] ED. Chest CT there showed an old PE that appeared to be chronic. She was also incidentally found to have a 46mm aneurysm of the right renal artery near the hilum as well as an irregular narrowing with a beaded appearance in bilateral renal arteries (right > left) concerning for fibromuscular dysplasia. She reported she was under a lot of stress at the time and had not been eating or drinking very much. Following this event, she was instructed to cut her Tribenzor in half but BP remained low. She was switched to Losartan and then her BP was running above goal. She then stopped taking all of her medications and then her BP was elevated. Therefore, she was referred to Dr. Duke Salvia. Syncopal episode episode was felt to be secondary to intravascular volume depletion but she was not felt to  need as much medication after losing 20 lbs the prior year. She was restarted on Olmesartan and HCTZ but Amlodipine was stopped. CTA of head through pelvis was also ordered to assess for aneurysm and dysplasia given concern for fibromuscular dysplasia. CTA showed probable fibromuscular dysplasia in the bilateral renal arteries with a 0.8cm aneurysm of branch vessel at the right renal hilum but no other evidence of aneurysms.   Patient presents today for follow-up. Patient doing well since last visit. BP well controlled in the office and patient states it usually is at goal at home. She denies any chest pain, shortness of breath, orthopnea, or PND. She occasional will notice mild lower extremity edema if she has had more water to drink than usual. She usually drinks 1 gallon of water per day and will notice some lower extremity if she drinks 1.5 gallons of water on days that she is at work all day. This may be due to the fact that she is on her feet all day rather than amount of water intake. No other signs of CHF. She notices occasional palpitations if she overexerts herself or has more caffeine to drink than usual but no significant/prolonged palpitations. No lightheadedness, dizziness, or near syncope/syncope. She is compliant with her medications and tolerating them well.  Past Medical History:  Diagnosis Date  . Arterial fibromuscular dysplasia ([facility]) 07/13/2019  . Essential hypertension 07/13/2019  . Hypertension   . Pulmonary embolism ([facility]) 07/13/2019   Chronic PE noted on chest CT 06/2019  . Tendonitis of shoulder 2009  bil shoulders     Past Surgical History:  Procedure Laterality Date  . ABDOMINAL HYSTERECTOMY    . BILATERAL SALPINGECTOMY Bilateral 04/23/2013   Procedure: BILATERAL SALPINGECTOMY;  Surgeon: Marvene Staff, MD;  Location: [facility];  Service: Gynecology;  Laterality: Bilateral;  . TUBAL LIGATION      Current Medications: Current Meds  Medication Sig  .  hydrochlorothiazide (HYDRODIURIL) 12.5 MG tablet TAKE 1 TABLET BY MOUTH EVERY DAY  . Multiple Vitamin (MULITIVITAMIN WITH MINERALS) TABS Take 1 tablet by mouth 2 (two) times daily. Mega Woman Multivitamin from [facility]  . olmesartan (BENICAR) 20 MG tablet TAKE 1 TABLET BY MOUTH EVERY DAY  . Omega-3 Fatty Acids (FISH OIL) 1000 MG CAPS Take 1 capsule by mouth daily.     Allergies:   Patient has no known allergies.   Social History   Socioeconomic History  . Marital status: Single    Spouse name: Not on file  . Number of children: Not on file  . Years of education: Not on file  . Highest education level: Not on file  Occupational History  . Not on file  Tobacco Use  . Smoking status: Former Research scientist (life sciences)  . Smokeless tobacco: Never Used  Substance and Sexual Activity  . Alcohol use: Yes    Alcohol/week: 2.0 standard drinks    Types: 2 Cans of beer per week  . Drug use: No  . Sexual activity: Not on file  Other Topics Concern  . Not on file  Social History Narrative  . Not on file   Social Determinants of Health   Financial Resource Strain: Not on file  Food Insecurity: Not on file  Transportation Needs: Not on file  Physical Activity: Not on file  Stress: Not on file  Social Connections: Not on file     Family History: The patient's family history includes Aortic aneurysm in her father; Breast cancer in her maternal grandmother; Diabetes in her maternal grandfather; Hypertension in her mother, sister, and sister; Uterine cancer in her paternal grandmother.  ROS:   Please see the history of present illness.     EKGs/Labs/Other Studies Reviewed:    The following studies were reviewed today:  Head/Neck/Chest/Abdominal/Pelvic CTA 08/13/2019: Impression: 1. 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 _______________  Abdominal/Pelvic CTA 09/20/2020 [facility]): Conclusion: 1. 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 No significant change from CTA dated 03/22/2020.  2. Ancillary findings described in detail above.  EKG:  EKG ordered today. EKG personally reviewed and demonstrates normal sinus rhythm, rate 60 bpm, with possible left atrial enlargement and T wave inversions noted in leads aVL and V2. Normal axis. QTc 402 ms. No significant changes compared to prior tracing.  Recent Labs: 02/26/2020: ALT 167; BUN <5; Creatinine, Ser 0.92; Hemoglobin 10.8; Platelets 185; Potassium 3.1; Sodium 138  Recent Lipid Panel    Component Value Date/Time   CHOL 138 03/28/2017 0900   TRIG 65 03/28/2017 0900   HDL 73 03/28/2017 0900   CHOLHDL 1.9 03/28/2017 0900   VLDL 13 03/28/2017 0900   LDLCALC 52 03/28/2017 0900    Physical Exam:    Vital Signs: BP 124/76   Pulse 60   Ht 5\' 8"  (1.727 m)   Wt 154 lb 12.8 oz (70.2 kg)   LMP 03/31/2013   SpO2 97%   BMI  23.54 kg/m     Wt Readings from Last 3 Encounters:  10/04/20 154 lb 12.8 oz (70.2 kg)  02/26/20 135 lb (61.2 kg)  07/13/19 147 lb (66.7 kg)     General: 45 y.o. African-American  female in no acute distress. HEENT: Normocephalic and atraumatic. Sclera clear.  Neck: Supple. No carotid bruits. No JVD. Heart: RRR. Distinct S1 and S2. No murmurs, gallops, or rubs. Radial pulses 2+ and equal bilaterally. Lungs: No increased work of breathing. Clear to ausculation bilaterally. No wheezes, rhonchi, or rales.  Abdomen: Soft, non-distended, and non-tender to palpation. B Extremities: No lower extremity edema.    Skin: Warm and dry. Neuro: Alert and oriented x3. No focal deficits. Psych: Normal affect. Responds appropriately.  Assessment:    1. Primary hypertension   2. Arterial fibromuscular dysplasia (HCC)   3. 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 Bilateral lower extremity edema   5. Chronic pulmonary embolism, unspecified  pulmonary embolism type, unspecified whether acute cor pulmonale present ([place])     Plan:    Hypertension - BP well controlled. - Current medications: HCTZ 12.5mg  daily and Olmesartan 20mg  daily. - Continue to monitor BP at home. Advised patient to let us know if consistent above goal of <130/80. - Renal function and electrolytes normal on CMET in 08/2020 in Care Everywhere.   Fibromuscular Dysplasia Renal Artery Aneurysm  - CTA from head to pelvis in 08/2019 showed probable fibromuscular dysplasia in the bilateral renal arteries with a 0.8cm aneurysm of a branch vessel at the rght renal hilum but no other evidence of aneurysm or dysplasia.  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 No significant change from prior imaging.  - Will defer how often patient needs to have CTA for routine monitoring of this to Dr. Oval Linsey.  Lower Extremity Edema - Patient noted occasional mild lower extremity edema on days that she drinks more water than usual She usually drinks 1 gallon of water per day and will notice some lower extremity if she drinks 1.5 gallons of water on days that she is at work all day. This may be due to the fact that she is on her feet all day rather than amount of water intake.  - No edema on exam today. - She already follows a low sodium diet.  - Discussed trying compression stockings.  - No other signs of CHF. Discussed that if this worsens or if she develops other signs of CHF, will likely need to decrease water intake.  Chronic PE - Noted on CTA in 06/2019. She was not started on anticoagulation.  - No symptoms of shortness of breath or RV failure.   Disposition: Follow up in 6 months with Dr. Oval Linsey.   Medication Adjustments/Labs and Tests Ordered: Current medicines are reviewed at length with the patient today.  Concerns regarding medicines are outlined above.  Orders Placed  This Encounter  Procedures  . EKG 12-Lead   No orders of the defined types were placed in this encounter.   Patient Instructions  Medication Instructions:  Your physician recommends that you continue on your current medications as directed. Please refer to the Current Medication list given to you today.  *If you need a refill on your cardiac medications before your next appointment, please call your pharmacy*   Lab Work: None ordered.   Testing/Procedures: None ordered.   Follow-Up: At [facility], you and your health needs are  our priority.  As part of our continuing mission to provide you with exceptional heart care, we have created designated Provider Care Teams.  These Care Teams include your primary Cardiologist (physician) and Advanced Practice Providers (APPs -  Physician Assistants and Nurse Practitioners) who all work together to provide you with the care you need, when you need it.  We recommend signing up for the patient portal called "MyChart".  Sign up information is provided on this After Visit Summary.  MyChart is used to connect with patients for Virtual Visits (Telemedicine).  Patients are able to view lab/test results, encounter notes, upcoming appointments, etc.  Non-urgent messages can be sent to your provider as well.   To learn more about what you can do with MyChart, go to [email or web address].    Your next appointment:   6 month(s)  The format for your next appointment:   In Person  Provider:   Skeet Latch, MD   Other Instructions Please get an Omron blood pressure cuff to monitor your blood pressure.  Call our office if your blood pressure is consistently over 130/80.     Signed, Darreld Mclean, PA-C  10/04/2020 9:28 AM    [facility]

## 2020-10-04 ENCOUNTER — Ambulatory Visit (INDEPENDENT_AMBULATORY_CARE_PROVIDER_SITE_OTHER): Payer: No Typology Code available for payment source | Admitting: Student

## 2020-10-04 ENCOUNTER — Encounter: Payer: Self-pay | Admitting: Student

## 2020-10-04 ENCOUNTER — Other Ambulatory Visit: Payer: Self-pay

## 2020-10-04 VITALS — BP 124/76 | HR 60 | Ht 68.0 in | Wt 154.8 lb

## 2020-10-04 DIAGNOSIS — R600 Localized edema: Secondary | ICD-10-CM | POA: Diagnosis not present

## 2020-10-04 DIAGNOSIS — I2782 Chronic pulmonary embolism: Secondary | ICD-10-CM

## 2020-10-04 DIAGNOSIS — I773 Arterial fibromuscular dysplasia: Secondary | ICD-10-CM | POA: Diagnosis not present

## 2020-10-04 DIAGNOSIS — I722 Aneurysm of renal artery: Secondary | ICD-10-CM

## 2020-10-04 DIAGNOSIS — I10 Essential (primary) hypertension: Secondary | ICD-10-CM

## 2020-10-04 NOTE — Patient Instructions (Signed)
Medication Instructions:  Your physician recommends that you continue on your current medications as directed. Please refer to the Current Medication list given to you today.  *If you need a refill on your cardiac medications before your next appointment, please call your pharmacy*   Lab Work: None ordered.   Testing/Procedures: None ordered.   Follow-Up: At [facility], you and your health needs are our priority.  As part of our continuing mission to provide you with exceptional heart care, we have created designated Provider Care Teams.  These Care Teams include your primary Cardiologist (physician) and Advanced Practice Providers (APPs -  Physician Assistants and Nurse Practitioners) who all work together to provide you with the care you need, when you need it.  We recommend signing up for the patient portal called "MyChart".  Sign up information is provided on this After Visit Summary.  MyChart is used to connect with patients for Virtual Visits (Telemedicine).  Patients are able to view lab/test results, encounter notes, upcoming appointments, etc.  Non-urgent messages can be sent to your provider as well.   To learn more about what you can do with MyChart, go to [email or web address].    Your next appointment:   6 month(s)  The format for your next appointment:   In Person  Provider:   Skeet Latch, MD   Other Instructions Please get an Omron blood pressure cuff to monitor your blood pressure.  Call our office if your blood pressure is consistently over 130/80.

## 2021-02-04 IMAGING — CT CT ANGIO NECK
1 of 12 series · 4 of 33 positions shown · IV contrast (omnipaque)
Comparison: Head CT February 21, 2008.

CLINICAL DATA: Fibromuscular dysplasia.

EXAM:
CT ANGIOGRAPHY HEAD AND NECK
TECHNIQUE: Multidetector CT imaging of the head and neck was performed using
the standard protocol during bolus administration of intravenous
contrast. Multiplanar CT image reconstructions and MIPs were
obtained to evaluate the vascular anatomy. Carotid stenosis
measurements (when applicable) are obtained utilizing NASCET
criteria, using the distal internal carotid diameter as the
denominator.
CONTRAST:  60mL OMNIPAQUE IOHEXOL 350 MG/ML SOLN; 80mL OMNIPAQUE
IOHEXOL 350 MG/ML SOLN, 60mL OMNIPAQUE IOHEXOL 350 MG/ML SOLN

[Series 11: thins · axial · 0.49mm/px · z∈[-141,+70]mm · 4 of 707 slices shown]
[im 142/707  soft-tissue]
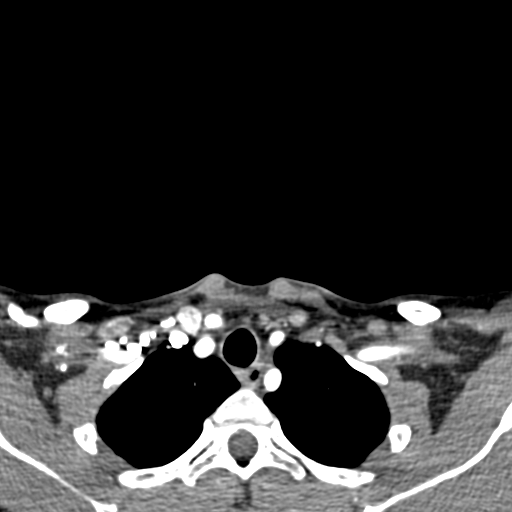
[im 283/707  bone]
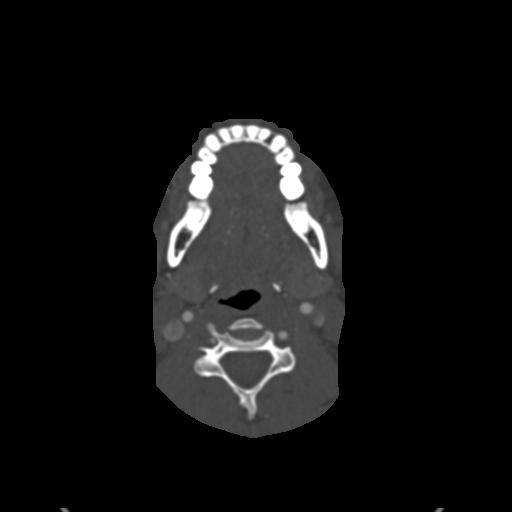
[im 424/707  soft-tissue]
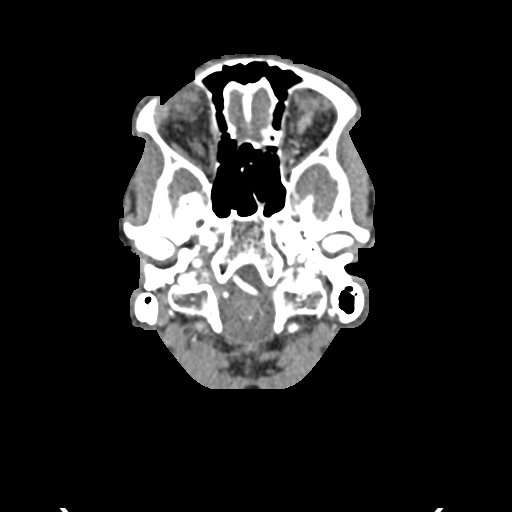
[im 565/707  bone]
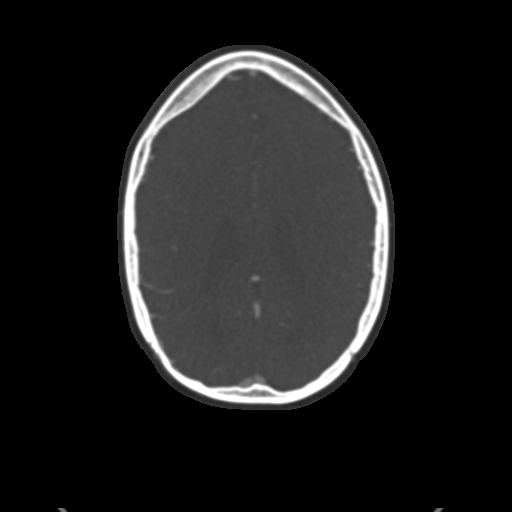

[4 of 33 positions shown; findings below may reference images not displayed]

FINDINGS: CT HEAD FINDINGS

Brain: No evidence of acute infarction, hemorrhage, hydrocephalus,
extra-axial collection or mass lesion/mass effect. Small subcortical
hypodense focus is seen in the anterior right cingulate gyrus,
unchanged from prior CT.

Vascular: Calcified plaques in the bilateral carotid siphons.

Skull: Normal. Negative for fracture or focal lesion.

Sinuses: Imaged portions are clear.

Orbits: No acute finding.

Review of the MIP images confirms the above findings

CTA NECK FINDINGS

Aortic arch: Standard branching. Imaged portion shows no evidence of
aneurysm or dissection. No significant stenosis of the major arch
vessel origins.

Right carotid system: Minimal atherosclerotic disease in the right
carotid bulb. There is no stenosis.

Left carotid system: No evidence of dissection, stenosis or
occlusion.

Vertebral arteries: Left dominant. No evidence of dissection,
stenosis or occlusion.

Skeleton: No aggressive bone lesion identified.

Other neck: Negative

Upper chest: Negative

Review of the MIP images confirms the above findings

CTA HEAD FINDINGS

Anterior circulation: No significant stenosis, proximal occlusion,
aneurysm, or vascular malformation.

Posterior circulation: No significant stenosis, proximal occlusion,
aneurysm, or vascular malformation.

Venous sinuses: As permitted by contrast timing, patent.

Anatomic variants: No significant

Review of the MIP images confirms the above findings
IMPRESSION: 1. No acute intracranial abnormality.
2. Early atherosclerotic changes of the carotid siphons and right
carotid bulb. No stenosis.
3. No radiographic evidence of arterial wall abnormality.

## 2021-05-18 ENCOUNTER — Other Ambulatory Visit: Payer: Self-pay | Admitting: Cardiovascular Disease

## 2021-08-13 ENCOUNTER — Other Ambulatory Visit: Payer: Self-pay | Admitting: Cardiovascular Disease

## 2021-08-21 ENCOUNTER — Ambulatory Visit (INDEPENDENT_AMBULATORY_CARE_PROVIDER_SITE_OTHER): Payer: No Typology Code available for payment source | Admitting: Cardiovascular Disease

## 2021-08-21 ENCOUNTER — Encounter (HOSPITAL_BASED_OUTPATIENT_CLINIC_OR_DEPARTMENT_OTHER): Payer: Self-pay | Admitting: Cardiovascular Disease

## 2021-08-21 VITALS — BP 138/84 | HR 60 | Ht 68.0 in | Wt 166.8 lb

## 2021-08-21 DIAGNOSIS — I773 Arterial fibromuscular dysplasia: Secondary | ICD-10-CM

## 2021-08-21 DIAGNOSIS — R002 Palpitations: Secondary | ICD-10-CM

## 2021-08-21 DIAGNOSIS — I10 Essential (primary) hypertension: Secondary | ICD-10-CM

## 2021-08-21 DIAGNOSIS — Z79899 Other long term (current) drug therapy: Secondary | ICD-10-CM

## 2021-08-21 HISTORY — DX: Palpitations: R00.2

## 2021-08-21 NOTE — Assessment & Plan Note (Signed)
Blood pressure is poorly controlled today both initially and on repeat.  At home her blood pressures been well controlled.  Lately she has been stressed because she had 3 recent deaths in the family.  We will continue with the hydrochlorothiazide and olmesartan for now.  She will keep checking her blood pressures and let us know if they do not come back down to less than 130/80. ?

## 2021-08-21 NOTE — Patient Instructions (Addendum)
Medication Instructions:  ?Your Physician recommend you continue on your current medication as directed.   ? ?*If you need a refill on your cardiac medications before your next appointment, please call your pharmacy* ? ? ?Lab Work: ?Your provider has recommended lab work today ( TSH, CBC, CMP, Lipid). Please have this collected at [facility]. The lab is open 8:00 am - 4:30 pm. Please avoid 12:00p - 1:00p for lunch hour. You do not need an appointment. Please go to [address] [place], [place] [address]. This is in the Primary Care office on the 3rd floor, let them know you are there for blood work and they will direct you to the lab. ? ?If you have labs (blood work) drawn today and your tests are completely normal, you will receive your results only by: ?MyChart Message (if you have MyChart) OR ?A paper copy in the mail ?If you have any lab test that is abnormal or we need to change your treatment, we will call you to review the results. ? ? ?Testing/Procedures: ?Your physician has requested that you have a MR Angiogram in May, 2023  ? ? ? ?Follow-Up: ?At [facility], you and your health needs are our priority.  As part of our continuing mission to provide you with exceptional heart care, we have created designated Provider Care Teams.  These Care Teams include your primary Cardiologist (physician) and Advanced Practice Providers (APPs -  Physician Assistants and Nurse Practitioners) who all work together to provide you with the care you need, when you need it. ? ?We recommend signing up for the patient portal called "MyChart".  Sign up information is provided on this After Visit Summary.  MyChart is used to connect with patients for Virtual Visits (Telemedicine).  Patients are able to view lab/test results, encounter notes, upcoming appointments, etc.  Non-urgent messages can be sent to your provider as well.   ?To learn more about what you can do with MyChart, go to  [email or web address].   ? ?Your next appointment:   ?4 month(s) ? ?The format for your next appointment:   ?In Person ? ?Provider:   ?Skeet Latch, MD{ ? ? ? ?Important Information About Sugar ? ? ? ? ? ? ?

## 2021-08-21 NOTE — Assessment & Plan Note (Signed)
She has renal artery FMD.  No evidence in any other vascular territories.  Given that she has had multiple skin CT scans and is young, we will get an MRA in May to assess. ?

## 2021-08-21 NOTE — Assessment & Plan Note (Signed)
She has struggled with palpitations a couple times per day.  Each episode last for only a second at a time.  It sounds like she is having only PVCs or PACs.  We will check a TSH, CBC, and CMP.  She has been anemic in the past.  Alcohol intake is much better and she has minimal caffeine.  She is struggling with anxiety.  She has been on BuSpar and wants to know if she has any alternatives.  Given that she is already on 1 agent, recommend that she follow-up with PCP.  She does not yet have 1 assigned and recommended Dr. Glendale Chard. ?

## 2021-08-21 NOTE — Progress Notes (Signed)
? ?Hypertension Clinic Follow-up:   ? ?Date:  08/21/2021  ? ?ID:  Mallory Fernandez, DOB 1975/06/02, MRN [id number] ? ?PCP:  Skeet Latch, MD  ?Cardiologist:  Skeet Latch, MD  ?Nephrologist: ? ?Referring MD: Dr. Garwin Brothers ? ?CC: Hypertension ? ?History of Present Illness:   ? ?Mallory Fernandez is a 46 y.o. female with a hx of chronic pulmonary embolism, hypertension, fibromuscular dysplasia, and alcololism here for follow-up. She initially established care in the hypertension clinic 07/13/2019.  She reported having hypertension for approximately 3 years.  She previously followed up with Dr. Criss Rosales and her BP was well-controlled on amlodipine, olmesartan and HCTZ.  She switched care to Dr. Vista Lawman.  In general her BP was well-controlled around 696-295 systolic.  On 2/22 she had an episode of syncope.  Her BP was 60/40.  She thought her machine was reading incorrectly.  When she stood up she passed out and hit the floor.  She had a significant laceration to her head and lip.  She went to urgent care and they referred her to [facility]. In the ED she had a chest CT that revealed an old PE that appeared chronic.  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  There was also an irregular narrowing with a beaded appearance in the right greater than left renal arteries concerning for fibromuscular dysplasia.  She noted that she was not eating or drinking very much at the time because she was very stressed.  She was told that she had to move out of her home within 30 days.  She also lost her dog and is stressed because her son recently moved out, so she is all alone.  She struggled with EtOH in the past.  She also quit smoking.  After leaving the hospital she was instructed to reduce her Tribenzor in half.  When she did this her blood pressure was still running a little low but better.  She saw Dr. Matthias Hughs office and was switched to losartan.  Her  BP was then running above goal.  She followed up with Dr. Garwin Brothers on 3/1 and decided to stop taking all of her medications.  Her BP had been elevated. She was admitted 01/2019 with acute alcoholic pancreatitis.   ? ?She saw Sande Rives, PA on 09/2020 and her BP was well controlled on HCTZ and olmesartan. She had a CTA of the abdomen and pelvis 09/2020 at [facility] that showed stable renal artery aneurysms. She had an episode of pancreatitis 04/2021 that was thought to be due to alcohol. Today, she is feeling good overall aside from some anxiety. In February her BP was averaging in the 110s-120s/70s. However, she states this past month her BP has been "horrible." She has struggled with multiple life stressors including recent deaths in her family. About twice in one day she feels episodes of palpitations or skipped beats. This is associated with a catch in her breath and she feels like she needs to cough. The entire episode will only last a few seconds. Three times a week she exercises at the gym using a treadmill. Generally she does not drink much caffeine in her diet. She denies any chest pain, shortness of breath, or peripheral edema. No lightheadedness, headaches, syncope, orthopnea, or PND. ? ?Previous antihypertensives: ?olmesartan ?HCTZ ?Amlodipine ?losartan ? ?Past Medical History:  ?Diagnosis Date  ? Arterial fibromuscular dysplasia ([place]) 07/13/2019  ? Essential hypertension 07/13/2019  ? Hypertension   ?  Palpitations 08/21/2021  ? Pulmonary embolism ([place]) 07/13/2019  ? Chronic PE noted on chest CT 06/2019  ? Tendonitis of shoulder 2009  ? bil shoulders   ? ? ?Past Surgical History:  ?Procedure Laterality Date  ? ABDOMINAL HYSTERECTOMY    ? BILATERAL SALPINGECTOMY Bilateral 04/23/2013  ? Procedure: BILATERAL SALPINGECTOMY;  Surgeon: Marvene Staff, MD;  Location: [facility];  Service: Gynecology;  Laterality: Bilateral;  ? TUBAL LIGATION    ? ? ?Current Medications: ?Current Meds  ?Medication Sig  ? busPIRone  (BUSPAR) 10 MG tablet Take 10 mg by mouth 2 (two) times daily.  ? hydrochlorothiazide (HYDRODIURIL) 12.5 MG tablet TAKE 1 TABLET (12.5 MG TOTAL) BY MOUTH DAILY. MUST KEEP UPCOMING APPOINTMENT FOR FUTURE REFILLS  ? olmesartan (BENICAR) 20 MG tablet TAKE 1 TABLET (20 MG TOTAL) BY MOUTH DAILY. MUST KEEP UPCOMING APPOINTMENT FOR FUTURE REFILLS  ?  ? ?Allergies:   Patient has no known allergies.  ? ?Social History  ? ?Socioeconomic History  ? Marital status: Single  ?  Spouse name: Not on file  ? Number of children: Not on file  ? Years of education: Not on file  ? Highest education level: Not on file  ?Occupational History  ? Not on file  ?Tobacco Use  ? Smoking status: Former  ? Smokeless tobacco: Never  ?Substance and Sexual Activity  ? Alcohol use: Yes  ?  Alcohol/week: 2.0 standard drinks  ?  Types: 2 Cans of beer per week  ? Drug use: No  ? Sexual activity: Not on file  ?Other Topics Concern  ? Not on file  ?Social History Narrative  ? Not on file  ? ?Social Determinants of Health  ? ?Financial Resource Strain: Not on file  ?Food Insecurity: Not on file  ?Transportation Needs: Not on file  ?Physical Activity: Not on file  ?Stress: Not on file  ?Social Connections: Not on file  ?  ? ?Family History: ?The patient's  family history includes Aortic aneurysm in her father; Breast cancer in her maternal grandmother; Diabetes in her maternal grandfather; Hypertension in her mother, sister, and sister; Uterine cancer in her paternal grandmother. ? ?ROS:   ?Please see the history of present illness.    ?(+) Palpitations ?(+) Anxiety ?All other systems reviewed and are negative. ? ?EKGs/Labs/Other Studies Reviewed:   ? ?CTA Abdomen/Pelvis 09/20/2020 ([place]): ?FINDINGS:  ? ?LOWER CHEST  ?Mediastinum/hila: Visualized portion within normal limits.  ?Heart/vessels: Within normal limits.  ?Pleura: Within normal limits.  ?Lungs: Within normal limits.  ? ?ABDOMEN  ?Liver: Within normal limits.  ?Gallbladder/biliary:  Within normal limits.  ?Spleen: Within normal limits.  ?Pancreas: Within normal limits.  ?Adrenals: Within normal limits.  ?Kidneys: Symmetric renal enhancement without hydronephrosis. Lobulated contours bilaterally are most consistent with persistent fetal lobulations. 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  ?Peritoneum/mesenteries: Within normal limits.  ?Extraperitoneum: Within normal limits.  ?Gastrointestinal tract: No evidence of obstruction. Appendix within normal limits. Moderate colonic stool burden.  ? ?PELVIS  ?Ureters: Within normal limits.  ?Bladder: Within normal limits.  ?Reproductive system: Hysterectomy. An intermediate density structure within the right adnexa measuring 2.5 cm and approximately 35 Hounsfield units is most consistent with a hemorrhagic ovarian cyst and does not demonstrate enhancement between the noncontrast and contrast-enhanced sequences. An additional smaller simple appearing cyst is located more superiorly in the right adnexa.  ? ?VASCULAR  ?Aorta: Similar mild burden of calcified and noncalcified atherosclerosis without aneurysmal dilation or  significant narrowing.  ?Celiac trunk and branches: Within normal limits.  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  ?Inferior mesenteric trunk and branches: Within normal limits.  ?Renal arteries: Similar irregular contour of the right greater than left renal arteries with "string of beads" appearance consistent with fibromuscular dysplasia. There is similar associated mild to moderate narrowing of the mid left renal artery and mild multifocal narrowing of the right renal artery. 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  ?Iliac arteries: Mild burden of atherosclerosis  without aneurysmal dilation or significant narrowing.  ?Venous circulation: Within normal limits.  ? ?MSK: Age advanced right hip degenerative changes. No acute osseous abnormality.  ? ?CONCLUSION:  ? ?1.

## 2021-08-22 LAB — CBC
Hematocrit: 34.2 % (ref 34.0–46.6)
Hemoglobin: 11.8 g/dL (ref 11.1–15.9)
MCH: 33.4 pg — ABNORMAL HIGH (ref 26.6–33.0)
MCHC: 34.5 g/dL (ref 31.5–35.7)
MCV: 97 fL (ref 79–97)
Platelets: 328 10*3/uL (ref 150–450)
RBC: 3.53 x10E6/uL — ABNORMAL LOW (ref 3.77–5.28)
RDW: 14 % (ref 11.7–15.4)
WBC: 5 10*3/uL (ref 3.4–10.8)

## 2021-08-22 LAB — COMPREHENSIVE METABOLIC PANEL
ALT: 19 IU/L (ref 0–32)
AST: 20 IU/L (ref 0–40)
Albumin/Globulin Ratio: 1.7 (ref 1.2–2.2)
Albumin: 4.5 g/dL (ref 3.8–4.8)
Alkaline Phosphatase: 63 IU/L (ref 44–121)
BUN/Creatinine Ratio: 12 (ref 9–23)
BUN: 12 mg/dL (ref 6–24)
Bilirubin Total: <0.2 mg/dL (ref 0.0–1.2)
CO2: 22 mmol/L (ref 20–29)
Calcium: 9.3 mg/dL (ref 8.7–10.2)
Chloride: 98 mmol/L (ref 96–106)
Creatinine, Ser: 1.04 mg/dL — ABNORMAL HIGH (ref 0.57–1.00)
Globulin, Total: 2.6 g/dL (ref 1.5–4.5)
Glucose: 90 mg/dL (ref 70–99)
Potassium: 4.8 mmol/L (ref 3.5–5.2)
Sodium: 136 mmol/L (ref 134–144)
Total Protein: 7.1 g/dL (ref 6.0–8.5)
eGFR: 67 mL/min/{1.73_m2} (ref >59)

## 2021-08-22 LAB — LIPID PANEL
Chol/HDL Ratio: 2.4 ratio (ref 0.0–4.4)
Cholesterol, Total: 225 mg/dL — ABNORMAL HIGH (ref 100–199)
HDL: 93 mg/dL (ref >39)
LDL Chol Calc (NIH): 121 mg/dL — ABNORMAL HIGH (ref 0–99)
Triglycerides: 62 mg/dL (ref 0–149)
VLDL Cholesterol Cal: 11 mg/dL (ref 5–40)

## 2021-08-22 LAB — TSH: TSH: 0.578 u[IU]/mL (ref 0.450–4.500)

## 2021-08-23 ENCOUNTER — Encounter (HOSPITAL_BASED_OUTPATIENT_CLINIC_OR_DEPARTMENT_OTHER): Payer: Self-pay | Admitting: Cardiovascular Disease

## 2021-08-23 ENCOUNTER — Encounter (HOSPITAL_BASED_OUTPATIENT_CLINIC_OR_DEPARTMENT_OTHER): Payer: Self-pay

## 2021-08-25 ENCOUNTER — Other Ambulatory Visit (HOSPITAL_BASED_OUTPATIENT_CLINIC_OR_DEPARTMENT_OTHER): Payer: Self-pay | Admitting: *Deleted

## 2021-08-25 DIAGNOSIS — I10 Essential (primary) hypertension: Secondary | ICD-10-CM

## 2021-09-05 ENCOUNTER — Other Ambulatory Visit: Payer: Self-pay | Admitting: Cardiovascular Disease

## 2021-09-05 NOTE — Telephone Encounter (Signed)
Rx(s) sent to pharmacy electronically.  

## 2021-09-11 ENCOUNTER — Encounter (HOSPITAL_BASED_OUTPATIENT_CLINIC_OR_DEPARTMENT_OTHER): Payer: Self-pay

## 2021-09-20 ENCOUNTER — Other Ambulatory Visit (HOSPITAL_BASED_OUTPATIENT_CLINIC_OR_DEPARTMENT_OTHER): Payer: Self-pay | Admitting: *Deleted

## 2021-09-20 DIAGNOSIS — I773 Arterial fibromuscular dysplasia: Secondary | ICD-10-CM

## 2021-09-20 DIAGNOSIS — I10 Essential (primary) hypertension: Secondary | ICD-10-CM

## 2021-09-21 ENCOUNTER — Ambulatory Visit (HOSPITAL_BASED_OUTPATIENT_CLINIC_OR_DEPARTMENT_OTHER): Admission: RE | Admit: 2021-09-21 | Payer: No Typology Code available for payment source | Source: Ambulatory Visit

## 2021-09-29 ENCOUNTER — Encounter (HOSPITAL_BASED_OUTPATIENT_CLINIC_OR_DEPARTMENT_OTHER): Payer: Self-pay | Admitting: Cardiovascular Disease

## 2021-12-11 ENCOUNTER — Ambulatory Visit: Payer: Self-pay | Admitting: Internal Medicine

## 2021-12-18 ENCOUNTER — Encounter (HOSPITAL_BASED_OUTPATIENT_CLINIC_OR_DEPARTMENT_OTHER): Payer: Self-pay | Admitting: *Deleted

## 2021-12-18 DIAGNOSIS — Z8719 Personal history of other diseases of the digestive system: Secondary | ICD-10-CM | POA: Insufficient documentation

## 2021-12-18 DIAGNOSIS — F1011 Alcohol abuse, in remission: Secondary | ICD-10-CM | POA: Insufficient documentation

## 2021-12-18 DIAGNOSIS — Z86018 Personal history of other benign neoplasm: Secondary | ICD-10-CM | POA: Insufficient documentation

## 2021-12-20 ENCOUNTER — Encounter (HOSPITAL_BASED_OUTPATIENT_CLINIC_OR_DEPARTMENT_OTHER): Payer: Self-pay

## 2021-12-25 ENCOUNTER — Ambulatory Visit (HOSPITAL_BASED_OUTPATIENT_CLINIC_OR_DEPARTMENT_OTHER): Payer: No Typology Code available for payment source | Admitting: Cardiovascular Disease

## 2021-12-25 NOTE — Progress Notes (Incomplete)
Hypertension Clinic Follow-up:    Date:  12/25/2021   ID:  Mallory Fernandez, DOB 08-18-1975, MRN [id number]  PCP:  Skeet Latch, MD  Cardiologist:  Skeet Latch, MD  Nephrologist:  Referring MD: Dr. Garwin Brothers  CC: Hypertension  History of Present Illness:    Mallory Fernandez is a 46 y.o. female with a hx of chronic pulmonary embolism, hypertension, fibromuscular dysplasia, and alcololism here for follow-up. She initially established care in the hypertension clinic 07/13/2019.  She reported having hypertension for approximately 3 years.  She previously followed up with Dr. Criss Rosales and her BP was well-controlled on amlodipine, olmesartan and HCTZ.  She switched care to Dr. Vista Lawman.  In general her BP was well-controlled around 683-419 systolic.  On 2/22 she had an episode of syncope.  Her BP was 60/40.  She thought her machine was reading incorrectly.  When she stood up she passed out and hit the floor.  She had a significant laceration to her head and lip.  She went to urgent care and they referred her to [facility]. In the ED she had a chest CT that revealed an old PE that appeared chronic.  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  There was also an irregular narrowing with a beaded appearance in the right greater than left renal arteries concerning for fibromuscular dysplasia.  She noted that she was not eating or drinking very much at the time because she was very stressed.  She was told that she had to move out of her home within 30 days.  She also lost her dog and is stressed because her son recently moved out, so she is all alone.  She struggled with EtOH in the past.  She also quit smoking.  After leaving the hospital she was instructed to reduce her Tribenzor in half.  When she did this her blood pressure was still running a little low but better.  She saw Dr. Matthias Hughs office and was switched to losartan.  Her  BP was then running above goal.  She followed up with Dr. Garwin Brothers on 3/1 and decided to stop taking all of her medications.  Her BP had been elevated. She was admitted 01/2019 with acute alcoholic pancreatitis.    She saw Sande Rives, PA on 09/2020 and her BP was well controlled on HCTZ and olmesartan. She had a CTA of the abdomen and pelvis 09/2020 at [facility] that showed stable renal artery aneurysms. She had an episode of pancreatitis 04/2021 that was thought to be due to alcohol. Today, she is feeling good overall aside from some anxiety. In February her BP was averaging in the 110s-120s/70s. However, she states this past month her BP has been "horrible." She has struggled with multiple life stressors including recent deaths in her family. About twice in one day she feels episodes of palpitations or skipped beats. This is associated with a catch in her breath and she feels like she needs to cough. The entire episode will only last a few seconds. Three times a week she exercises at the gym using a treadmill. Generally she does not drink much caffeine in her diet.   Today,  She denies any palpitations, chest pain, shortness of breath, or peripheral edema. No lightheadedness, headaches, syncope, orthopnea, or PND.   (+)  Previous antihypertensives: olmesartan HCTZ Amlodipine losartan  Past Medical History:  Diagnosis Date   Alcoholic pancreatitis 62/22/9798  Arterial fibromuscular dysplasia ([place]) 07/13/2019   Essential hypertension 07/13/2019   Hypertension    Palpitations 08/21/2021   Pulmonary embolism ([place]) 07/13/2019   Chronic PE noted on chest CT 06/2019   Tendonitis of shoulder 2009   bil shoulders     Past Surgical History:  Procedure Laterality Date   ABDOMINAL HYSTERECTOMY     BILATERAL SALPINGECTOMY Bilateral 04/23/2013   Procedure: BILATERAL SALPINGECTOMY;  Surgeon: Marvene Staff, MD;  Location: [facility];  Service: Gynecology;  Laterality: Bilateral;   TUBAL LIGATION       Current Medications: No outpatient medications have been marked as taking for the 12/25/21 encounter (Appointment) with Skeet Latch, MD.     Allergies:   Patient has no known allergies.   Social History   Socioeconomic History   Marital status: Single    Spouse name: Not on file   Number of children: Not on file   Years of education: Not on file   Highest education level: Not on file  Occupational History   Not on file  Tobacco Use   Smoking status: Former   Smokeless tobacco: Never  Substance and Sexual Activity   Alcohol use: Yes    Alcohol/week: 2.0 standard drinks of alcohol    Types: 2 Cans of beer per week   Drug use: No   Sexual activity: Not on file  Other Topics Concern   Not on file  Social History Narrative   Not on file   Social Determinants of Health   Financial Resource Strain: Not on file  Food Insecurity: Not on file  Transportation Needs: Not on file  Physical Activity: Not on file  Stress: Not on file  Social Connections: Not on file     Family History: The patient's  family history includes Aortic aneurysm in her father; Breast cancer in her maternal grandmother; Diabetes in her maternal grandfather; Hypertension in her mother, sister, and sister; Uterine cancer in her paternal grandmother.  ROS:   Please see the history of present illness.     All other systems reviewed and are negative.  EKGs/Labs/Other Studies Reviewed:    CTA Abdomen/Pelvis 09/20/2020 ([facility]): FINDINGS:   LOWER CHEST  Mediastinum/hila: Visualized portion within normal limits.  Heart/vessels: Within normal limits.  Pleura: Within normal limits.  Lungs: Within normal limits.   ABDOMEN  Liver: Within normal limits.  Gallbladder/biliary: Within normal limits.  Spleen: Within normal limits.  Pancreas: Within normal limits.  Adrenals: Within normal limits.  Kidneys: Symmetric renal enhancement without hydronephrosis. Lobulated contours bilaterally  are most consistent with persistent fetal lobulations. 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  Peritoneum/mesenteries: Within normal limits.  Extraperitoneum: Within normal limits.  Gastrointestinal tract: No evidence of obstruction. Appendix within normal limits. Moderate colonic stool burden.   PELVIS  Ureters: Within normal limits.  Bladder: Within normal limits.  Reproductive system: Hysterectomy. An intermediate density structure within the right adnexa measuring 2.5 cm and approximately 35 Hounsfield units is most consistent with a hemorrhagic ovarian cyst and does not demonstrate enhancement between the noncontrast and contrast-enhanced sequences. An additional smaller simple appearing cyst is located more superiorly in the right adnexa.   VASCULAR  Aorta: Similar mild burden of calcified and noncalcified atherosclerosis without aneurysmal dilation or significant narrowing.  Celiac trunk and branches: Within normal limits.  Superior mesenteric trunk and branches: Accessory right hepatic artery arising from the SMA.  Inferior mesenteric trunk and branches: Within normal limits.  Renal  arteries: Similar irregular contour of the right greater than left renal arteries with "string of beads" appearance consistent with fibromuscular dysplasia. There is similar associated mild to moderate narrowing of the mid left renal artery and mild multifocal narrowing of the right renal artery. 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  Iliac arteries: Mild burden of atherosclerosis without aneurysmal dilation or significant narrowing.  Venous circulation: Within normal limits.   MSK: Age advanced right hip degenerative changes. No acute osseous abnormality.   CONCLUSION:   1.  Similar  findings of fibromuscular dysplasia involving the renal arteries with similar size of the saccular renal artery aneurysm measuring 8 to 9 mm. No significant change from CTA dated 03/22/2020.  2.  Ancillary findings described in detail above.  CTA Chest/Aorta 08/13/2019: FINDINGS: CTA CHEST FINDINGS   Cardiovascular: Heart size normal. No pericardial effusion. Fair contrast opacification of pulmonary arterial tree; the exam was not optimized for detection of pulmonary emboli. Adequate contrast opacification of the thoracic aorta with no evidence of dissection, aneurysm, or stenosis. There is classic 3-vessel brachiocephalic arch anatomy without proximal stenosis. Minimal atheromatous plaque in the mid distal descending thoracic segment.   Mediastinum/Nodes: No hilar or mediastinal adenopathy.   Lungs/Pleura: No pleural effusion. No pneumothorax. Lungs clear.   Musculoskeletal: No chest wall abnormality. No acute or significant osseous findings.   Review of the MIP images confirms the above findings.   CTA Abdomen and Pelvis 08/13/2019: FINDINGS   VASCULAR   Aorta: Mild calcified atheromatous plaque in the infrarenal segment. No aneurysm, dissection, or stenosis.   Celiac: Patent without evidence of aneurysm, dissection, vasculitis or significant stenosis.   SMA: Patent without evidence of aneurysm, dissection, vasculitis or significant stenosis.   Renals: Single left, with mild narrowing and web-like change in the midportion of the main segment. Distal branching unremarkable.   Single right, with a mildly beaded appearance and suggestion of intraluminal webs in the mid and distal moieties of the main segment. 0.8 cm aneurysm of branch vessel at the renal hilum.   IMA: Patent without evidence of aneurysm, dissection, vasculitis or significant stenosis.   Inflow: Patent without evidence of aneurysm, dissection, vasculitis or significant stenosis.   Veins: No obvious venous  abnormality within the limitations of this arterial phase study.   Review of the MIP images confirms the above findings.   NON-VASCULAR   Hepatobiliary: No focal liver abnormality is seen. No gallstones, gallbladder wall thickening, or biliary dilatation.   Pancreas: Unremarkable. No pancreatic ductal dilatation or surrounding inflammatory changes.   Spleen: Normal in size without focal abnormality.   Adrenals/Urinary Tract: Adrenal glands are unremarkable. Kidneys are normal, without renal calculi, focal lesion, or hydronephrosis. Bladder is unremarkable.   Stomach/Bowel: Stomach and small bowel nondistended. Normal appendix. Moderate proximal colonic fecal material without dilatation, decompressed distally.   Lymphatic: No abdominal or pelvic adenopathy.   Reproductive: Status post hysterectomy. No adnexal masses.   Other: Left pelvic phleboliths. No ascites. No free air.   Musculoskeletal: Right hip DJD. No fracture or worrisome bone lesion.   Review of the MIP images confirms the above findings.   IMPRESSION: 1. 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   Aortic Atherosclerosis (ICD10-I70.0).   EKG:  EKG is personally reviewed. 12/25/21: Sinus ***. Rate ***  bpm.   08/21/2021: Sinus rhythm. Rate 60 bpm. Nonspecific T wave abnormalities. 07/13/2019: sinus rhythm.  Rate 60 bpm.    Recent Labs: 08/21/2021: ALT 19; BUN 12; Creatinine, Ser 1.04; Hemoglobin 11.8; Platelets 328; Potassium 4.8; Sodium 136; TSH 0.578   Recent Lipid Panel    Component Value Date/Time   CHOL 225 (H) 08/21/2021 1625   TRIG 62 08/21/2021 1625   HDL 93 08/21/2021 1625   CHOLHDL 2.4 08/21/2021 1625   CHOLHDL 1.9 03/28/2017 0900   VLDL 13 03/28/2017 0900   LDLCALC 121 (H) 08/21/2021 1625    Physical Exam:    VS:  LMP 03/31/2013     Wt Readings from Last 3 Encounters:  08/21/21 166 lb 12.8 oz (75.7 kg)   10/04/20 154 lb 12.8 oz (70.2 kg)  02/26/20 135 lb (61.2 kg)    VS:  LMP 03/31/2013  , BMI There is no height or weight on file to calculate BMI. GENERAL:  Well appearing HEENT: Pupils equal round and reactive, fundi not visualized, oral mucosa unremarkable NECK:  No jugular venous distention, waveform within normal limits, carotid upstroke brisk and symmetric, no bruits LUNGS:  Clear to auscultation bilaterally HEART:  ***RRR.  PMI not displaced or sustained,S1 and S2 within normal limits, no S3, no S4, no clicks, no rubs, no murmurs ABD:  Flat, positive bowel sounds normal in frequency in pitch, no bruits, no rebound, no guarding, no midline pulsatile mass, no hepatomegaly, no splenomegaly EXT:  2 plus pulses throughout, no edema, no cyanosis no clubbing SKIN:  No rashes no nodules NEURO:  Cranial nerves II through XII grossly intact, motor grossly intact throughout PSYCH:  Cognitively intact, oriented to person place and time   ASSESSMENT:    No diagnosis found.   PLAN:    No problem-specific Assessment & Plan notes found for this encounter.   Disposition: FU with Tiffany C. Oval Linsey, MD, [facility] *** FU with MD/PharmD in 4 months.  Medication Adjustments/Labs and Tests Ordered: Current medicines are reviewed at length with the patient today.  Concerns regarding medicines are outlined above.   No orders of the defined types were placed in this encounter.  No orders of the defined types were placed in this encounter.   I,Breanna Adamick,acting as a scribe for Skeet Latch, MD.,have documented all relevant documentation on the behalf of Skeet Latch, MD,as directed by  Skeet Latch, MD while in the presence of Skeet Latch, MD.   I, Wayland Oval Linsey, MD have reviewed all documentation for this visit.  The documentation of the exam, diagnosis, procedures, and orders on 12/25/2021 are all accurate and complete.   Tommy Medal Adamick  12/25/2021 8:25 AM     [facility]

## 2022-06-12 LAB — HM COLONOSCOPY

## 2022-09-09 ENCOUNTER — Other Ambulatory Visit: Payer: Self-pay | Admitting: Cardiovascular Disease

## 2022-09-10 NOTE — Telephone Encounter (Signed)
Please call pt to schedule overdue HTN Clinic follow-up with Dr. Duke Salvia for refills. Pt last seen 08/2021 and was supposed to follow-up 4 months after. Pt No-showed 12/2021 appt and has not been seen since. Thank you!

## 2022-09-12 NOTE — Telephone Encounter (Signed)
Patient scheduled for 11/14/2022 with HTN Clinic.

## 2022-09-12 NOTE — Telephone Encounter (Signed)
Patient scheduled 10/4 on a general cardiology day. LVM to reschedule sooner with HTN Clinic.

## 2022-10-04 ENCOUNTER — Ambulatory Visit (HOSPITAL_BASED_OUTPATIENT_CLINIC_OR_DEPARTMENT_OTHER): Payer: No Typology Code available for payment source | Admitting: Cardiovascular Disease

## 2022-10-04 ENCOUNTER — Encounter (HOSPITAL_BASED_OUTPATIENT_CLINIC_OR_DEPARTMENT_OTHER): Payer: Self-pay | Admitting: Cardiovascular Disease

## 2022-10-04 VITALS — BP 120/76 | HR 63 | Ht 68.0 in | Wt 183.1 lb

## 2022-10-04 DIAGNOSIS — I10 Essential (primary) hypertension: Secondary | ICD-10-CM

## 2022-10-04 DIAGNOSIS — I773 Arterial fibromuscular dysplasia: Secondary | ICD-10-CM | POA: Diagnosis not present

## 2022-10-04 DIAGNOSIS — I700 Atherosclerosis of aorta: Secondary | ICD-10-CM | POA: Diagnosis not present

## 2022-10-04 DIAGNOSIS — E7800 Pure hypercholesterolemia, unspecified: Secondary | ICD-10-CM

## 2022-10-04 DIAGNOSIS — Z789 Other specified health status: Secondary | ICD-10-CM | POA: Diagnosis not present

## 2022-10-04 HISTORY — DX: Atherosclerosis of aorta: I70.0

## 2022-10-04 HISTORY — DX: Pure hypercholesterolemia, unspecified: E78.00

## 2022-10-04 MED ORDER — OLMESARTAN MEDOXOMIL 20 MG PO TABS: 20.0000 mg | ORAL_TABLET | Freq: Every day | ORAL | 3 refills | Status: DC

## 2022-10-04 MED ORDER — HYDROCHLOROTHIAZIDE 12.5 MG PO TABS: 12.5000 mg | ORAL_TABLET | Freq: Every day | ORAL | 3 refills | Status: DC

## 2022-10-04 NOTE — Patient Instructions (Signed)
Medication Instructions:  Your physician recommends that you continue on your current medications as directed. Please refer to the Current Medication list given to you today.  Labwork: FASTING LP/CMET/LPa IN 4 MONTHS   Testing/Procedures: NONE  Follow-Up: 1 YEAR IN HTN CLINIC   If you need a refill on your cardiac medications before your next appointment, please call your pharmacy.

## 2022-10-04 NOTE — Assessment & Plan Note (Signed)
Discussed working on diet and exercise.  She isn't interested in medication.  Recommended plant-based diet and exercise.

## 2022-10-04 NOTE — Assessment & Plan Note (Signed)
LDL goal <70.  She wants to avoid medication.  Work on lifestyle as above.  Repeat lipids/CMP and LP(a) in 4 months.

## 2022-10-04 NOTE — Assessment & Plan Note (Signed)
Significantly decreased.  Occurred in the setting of several life stressors.

## 2022-10-04 NOTE — Assessment & Plan Note (Signed)
Renal artery FMD.  Stable on CT-A 08/2022.  BP stable on current regimen.

## 2022-10-04 NOTE — Progress Notes (Signed)
Advanced Hypertension Clinic Follow-up:    Date:  10/04/2022   ID:  Mallory Fernandez, DOB 1975/07/01, MRN [id number]  PCP:  Pcp, No  Cardiologist:  Chilton Si, MD  Nephrologist:  Referring MD: Dr. Cherly Hensen  CC: Hypertension  History of Present Illness:    Mallory Fernandez is a 47 y.o. female with a hx of chronic pulmonary embolism, hypertension, fibromuscular dysplasia, and alcololism here for follow-up. She was initially seen in the hypertension clinic 07/13/2019.  She reported having hypertension for approximately 3 years.  She previously followed up with Dr. Parke Simmers and her BP was well-controlled on amlodipine, olmesartan and HCTZ.  She switched care to Dr. Julio Sicks.  In general her BP was well-controlled around 100-110 systolic.  On 2/22 she had an episode of syncope.  Her BP was 60/40.  She thought her machine was reading incorrectly.  When she stood up she passed out and hit the floor.  She had a significant laceration to her head and lip.  She went to urgent care and they referred her to [facility]. In the ED she had a chest CT that revealed an old PE that appeared chronic.  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  There was also an irregular narrowing with a beaded appearance in the right greater than left renal arteries concerning for fibromuscular dysplasia.  She noted that she was not eating or drinking very much at the time because she was very stressed.  She was told that she had to move out of her home within 30 days.  She also lost her dog and is stressed because her son recently moved out, so she is all alone.  She struggled with EtOH in the past.  She also quit smoking.  After leaving the hospital she was instructed to reduce her Tribenzor in half.  When she did this her blood pressure was still running a little low but better.  She saw Dr. Rubye Oaks office and was switched to losartan.  Her BP was then  running above goal.  She followed up with Dr. Cherly Hensen on 3/1 and decided to stop taking all of her medications.  Her BP had been elevated. She was admitted 01/2019 with acute alcoholic pancreatitis.    She saw Marjie Skiff, PA on 09/2020 and her BP was well controlled on HCTZ and olmesartan. She had a CTA of the abdomen and pelvis 09/2020 at [facility] that showed stable renal artery aneurysms. She had an episode of pancreatitis 04/2021 that was thought to be due to alcohol.   At her visit 08/2021, her blood pressure had been stable in February but was "horrible" since then in the setting of multiple life stressors and palpitations twice per day. She was struggling with anxiety and was interested in alternatives to BuSpar. We assisted with recommendations to find a PCP. She was admitted to the hospital 03/18/2022 for pancolitis. She had an abdominal/pelvic CTA 08/2022 revealing similar findings of fibromuscular dysplasia with unchanged right renal artery aneurysm.  Today, she states she is feeling pretty good. Normally she checks her blood pressure at work every day. Since her surgery she is monitoring once a week, and presents a BP log. Readings are mostly 120s/70s-80s, with frequent 110s and rarely in the 130s. In clinic today her blood pressure was initially 133/86, and improved to 120/76 on repeat. Recently she underwent rotator cuff surgery, which she notes was extensive and needed  a skin graft. As she has been recovering, she wasn't cooking as much at home as she usually does and ordered out maybe 3 times per week. She avoids fried foods. Although she has salads often, she admits to using a lot of salad dressing. As of 08/2021 her LDL was 121. She reports more recent lab work with Dr. Cherly Hensen. Her stress levels have been improving lately. She is now taking Buspar as needed. She had been started on Lexapro 10 mg by Dr. Cherly Hensen 09/03/22. She has been taking it 4 weeks and believes it has helped significantly.  She denies any palpitations, chest pain, shortness of breath, or peripheral edema. No lightheadedness, headaches, syncope, orthopnea, or PND.  Previous antihypertensives: olmesartan HCTZ Amlodipine losartan  Past Medical History:  Diagnosis Date   Alcoholic pancreatitis 03/28/2017   Aortic atherosclerosis (HCC) 10/04/2022   Arterial fibromuscular dysplasia (HCC) 07/13/2019   Essential hypertension 07/13/2019   Hypertension    Palpitations 08/21/2021   Pulmonary embolism (HCC) 07/13/2019   Chronic PE noted on chest CT 06/2019   Pure hypercholesterolemia 10/04/2022   Tendonitis of shoulder 2009   bil shoulders     Past Surgical History:  Procedure Laterality Date   ABDOMINAL HYSTERECTOMY     BILATERAL SALPINGECTOMY Bilateral 04/23/2013   Procedure: BILATERAL SALPINGECTOMY;  Surgeon: Serita Kyle, MD;  Location: [facility];  Service: Gynecology;  Laterality: Bilateral;   TUBAL LIGATION      Current Medications: Current Meds  Medication Sig   busPIRone (BUSPAR) 10 MG tablet Take 10 mg by mouth 2 (two) times daily.   escitalopram (LEXAPRO) 10 MG tablet Take 10 mg by mouth daily.   [DISCONTINUED] hydrochlorothiazide (HYDRODIURIL) 12.5 MG tablet TAKE 1 TABLET BY MOUTH EVERY DAY   [DISCONTINUED] olmesartan (BENICAR) 20 MG tablet TAKE 1 TABLET BY MOUTH EVERY DAY     Allergies:   Patient has no known allergies.   Social History   Socioeconomic History   Marital status: Single    Spouse name: Not on file   Number of children: Not on file   Years of education: Not on file   Highest education level: Not on file  Occupational History   Not on file  Tobacco Use   Smoking status: Former   Smokeless tobacco: Never  Substance and Sexual Activity   Alcohol use: Yes    Alcohol/week: 2.0 standard drinks of alcohol    Types: 2 Cans of beer per week   Drug use: No   Sexual activity: Not on file  Other Topics Concern   Not on file  Social History Narrative   Not on file    Social Determinants of Health   Financial Resource Strain: Not on file  Food Insecurity: Not on file  Transportation Needs: Not on file  Physical Activity: Not on file  Stress: Not on file  Social Connections: Not on file     Family History: The patient's  family history includes Aortic aneurysm in her father; Breast cancer in her maternal grandmother; Diabetes in her maternal grandfather; Hypertension in her mother, sister, and sister; Uterine cancer in her paternal grandmother.  ROS:   Please see the history of present illness.    All other systems reviewed and are negative.  EKGs/Labs/Other Studies Reviewed:    CTA Abdomen/Pelvis  09/04/2022  ([facility]): FINDINGS:  NONVASCULAR  Lower chest: Within normal limits.  Abdomen and pelvis: Interval resolution of left ovarian cyst. Right ovarian follicle measuring 2.2 x 2.4 cm. 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 Otherwise, unremarkable. Hysterectomy.  MSK: No acute osseous abnormality. Moderate degenerative changes of the right hip.   VASCULAR  Aorta: Mild atherosclerotic plaque. No aneurysm.  Celiac axis: Within normal limits.  Superior mesenteric artery: Accessory right hepatic artery arising from the SMA, decrease in conspicuity compared to prior which may relate to phase of contrast bolus timing.  Renal arteries: Similar irregular contour of the right greater than left renal arteries with beading in keeping with fibromuscular dysplasia. Similar associated mild narrowing of the mid left renal artery and mild multifocal narrowing of the right renal artery. Narrow neck saccular aneurysm arising from the inferior aspect of the distal right renal artery measuring 0.8 x 0.8 x 0.5 cm, grossly similar to prior. Single arteries bilaterally.  Inferior mesenteric artery: Within normal limits.  Iliac arteries: Mild atherosclerotic plaque. No aneurysm.  Femoral arteries: Within normal limits.  Venous  circulation: Pelvic phleboliths. Visualized veins are patent.   IMPRESSION:  1. Similar findings of fibromuscular dysplasia with unchanged right renal artery aneurysm.  2.  Ancillary findings as above.   Cardiac Event Monitor  05/2021: Scanned into Epic, personally reviewed. Predominantly normal sinus rhythm.  CTA Abdomen/Pelvis  09/20/2020 ([facility]): FINDINGS:   LOWER CHEST  Mediastinum/hila: Visualized portion within normal limits.  Heart/vessels: Within normal limits.  Pleura: Within normal limits.  Lungs: Within normal limits.   ABDOMEN  Liver: Within normal limits.  Gallbladder/biliary: Within normal limits.  Spleen: Within normal limits.  Pancreas: Within normal limits.  Adrenals: Within normal limits.  Kidneys: Symmetric renal enhancement without hydronephrosis. Lobulated contours bilaterally are most consistent with persistent fetal lobulations. 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  Peritoneum/mesenteries: Within normal limits.  Extraperitoneum: Within normal limits.  Gastrointestinal tract: No evidence of obstruction. Appendix within normal limits. Moderate colonic stool burden.   PELVIS  Ureters: Within normal limits.  Bladder: Within normal limits.  Reproductive system: Hysterectomy. An intermediate density structure within the right adnexa measuring 2.5 cm and approximately 35 Hounsfield units is most consistent with a hemorrhagic ovarian cyst and does not demonstrate enhancement between the noncontrast and contrast-enhanced sequences. An additional smaller simple appearing cyst is located more superiorly in the right adnexa.   VASCULAR  Aorta: Similar mild burden of calcified and noncalcified atherosclerosis without aneurysmal dilation or significant narrowing.  Celiac trunk and branches: Within normal limits.  Superior mesenteric trunk and branches: Accessory right hepatic artery arising from the  SMA.  Inferior mesenteric trunk and branches: Within normal limits.  Renal arteries: Similar irregular contour of the right greater than left renal arteries with "string of beads" appearance consistent with fibromuscular dysplasia. There is similar associated mild to moderate narrowing of the mid left renal artery and mild multifocal narrowing of the right renal artery. 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  Iliac arteries: Mild burden of atherosclerosis without aneurysmal dilation or significant narrowing.  Venous circulation: Within normal limits.   MSK: Age advanced right hip degenerative changes. No acute osseous abnormality.   CONCLUSION:   1.  Similar findings of fibromuscular dysplasia involving the renal arteries with similar size of the saccular renal artery aneurysm measuring 8 to 9 mm. No significant change from CTA dated 03/22/2020.  2.  Ancillary findings described in detail above.  CTA Chest/Aorta 08/13/2019: FINDINGS:  CTA CHEST FINDINGS   Cardiovascular: Heart size normal. No pericardial effusion. Fair contrast opacification of pulmonary arterial tree; the exam was not optimized for detection of pulmonary emboli. Adequate contrast opacification of the thoracic aorta with no evidence of dissection, aneurysm, or stenosis. There is classic 3-vessel brachiocephalic arch anatomy without proximal stenosis. Minimal atheromatous plaque in the mid distal descending thoracic segment.   Mediastinum/Nodes: No hilar or mediastinal adenopathy.   Lungs/Pleura: No pleural effusion. No pneumothorax. Lungs clear.   Musculoskeletal: No chest wall abnormality. No acute or significant osseous findings.   Review of the MIP images confirms the above findings.   CTA ABDOMEN AND PELVIS FINDINGS  IMPRESSION: 1. Probable fibromuscular  dysplasia in bilateral renal arteries, with 0.8 cm aneurysm of branch vessel at the right renal hilum.   Aortic Atherosclerosis (ICD10-I70.0).   EKG:  EKG is personally reviewed. 10/04/2022:  EKG was not ordered. 08/21/2021: Sinus rhythm. Rate 60 bpm. Nonspecific T wave abnormalities. 07/13/2019: sinus rhythm.  Rate 60 bpm.    Recent Labs: No results found for requested labs within last 365 days.   Recent Lipid Panel    Component Value Date/Time   CHOL 225 (H) 08/21/2021 1625   TRIG 62 08/21/2021 1625   HDL 93 08/21/2021 1625   CHOLHDL 2.4 08/21/2021 1625   CHOLHDL 1.9 03/28/2017 0900   VLDL 13 03/28/2017 0900   LDLCALC 121 (H) 08/21/2021 1625    Physical Exam:    Wt Readings from Last 3 Encounters:  10/04/22 183 lb 1.6 oz (83.1 kg)  08/21/21 166 lb 12.8 oz (75.7 kg)  10/04/20 154 lb 12.8 oz (70.2 kg)    VS:  BP 120/76 (BP Location: Right Arm, Patient Position: Sitting, Cuff Size: Large)   Pulse 63   Ht 5\' 8"  (1.727 m)   Wt 183 lb 1.6 oz (83.1 kg)   LMP 03/31/2013   BMI 27.84 kg/m  , BMI Body mass index is 27.84 kg/m. GENERAL:  Well appearing HEENT: Pupils equal round and reactive, fundi not visualized, oral mucosa unremarkable NECK:  No jugular venous distention, waveform within normal limits, carotid upstroke brisk and symmetric, no bruits LUNGS:  Clear to auscultation bilaterally HEART:  RRR.  PMI not displaced or sustained,S1 and S2 within normal limits, no S3, no S4, no clicks, no rubs, no murmurs ABD:  Flat, positive bowel sounds normal in frequency in pitch, no bruits, no rebound, no guarding, no midline pulsatile mass, no hepatomegaly, no splenomegaly EXT:  2 plus pulses throughout, no edema, no cyanosis no clubbing SKIN:  No rashes no nodules NEURO:  Cranial nerves II through XII grossly intact, motor grossly intact throughout PSYCH:  Cognitively intact, oriented to person place and time   ASSESSMENT:    1. Arterial fibromuscular dysplasia (HCC)   2.  Essential hypertension   3. Alcohol use   4. Aortic atherosclerosis (HCC)   5. Pure hypercholesterolemia     PLAN:    Arterial fibromuscular dysplasia (HCC) Renal artery FMD.  Stable on CT-A 08/2022.  BP stable on current regimen.  Essential hypertension BP stable on HCTZ and olmesartan.  Encouraged her to increase exercise to 150 minutes weekly.  Alcohol use Significantly decreased.  Occurred in the setting of several life stressors.  Aortic atherosclerosis (HCC) Discussed working on diet and exercise.  She isn't interested in medication.  Recommended plant-based diet and exercise.  Pure hypercholesterolemia LDL goal <70.  She wants to avoid medication.  Work on lifestyle as above.  Repeat lipids/CMP and  LP(a) in 4 months.    Disposition:    FU with Tregan Read C. Duke Salvia, MD, [facility] in 1 year.  Medication Adjustments/Labs and Tests Ordered: Current medicines are reviewed at length with the patient today.  Concerns regarding medicines are outlined above.   Orders Placed This Encounter  Procedures   Comprehensive metabolic panel   Lipoprotein A (LPA)   Lipid panel   Meds ordered this encounter  Medications   hydrochlorothiazide (HYDRODIURIL) 12.5 MG tablet    Sig: Take 1 tablet (12.5 mg total) by mouth daily.    Dispense:  90 tablet    Refill:  3   olmesartan (BENICAR) 20 MG tablet    Sig: Take 1 tablet (20 mg total) by mouth daily.    Dispense:  90 tablet    Refill:  3   I,Mathew Stumpf,acting as a scribe for Chilton Si, MD.,have documented all relevant documentation on the behalf of Chilton Si, MD,as directed by  Chilton Si, MD while in the presence of Chilton Si, MD.  I, Nabeeha Badertscher C. Duke Salvia, MD have reviewed all documentation for this visit.  The documentation of the exam, diagnosis, procedures, and orders on 10/04/2022 are all accurate and complete.  Signed, Chilton Si, MD  10/04/2022 8:42 AM    [facility]

## 2022-10-04 NOTE — Assessment & Plan Note (Signed)
BP stable on HCTZ and olmesartan.  Encouraged her to increase exercise to 150 minutes weekly.

## 2022-11-14 ENCOUNTER — Ambulatory Visit (HOSPITAL_BASED_OUTPATIENT_CLINIC_OR_DEPARTMENT_OTHER): Payer: No Typology Code available for payment source | Admitting: Cardiovascular Disease

## 2023-02-08 ENCOUNTER — Ambulatory Visit (HOSPITAL_BASED_OUTPATIENT_CLINIC_OR_DEPARTMENT_OTHER): Payer: No Typology Code available for payment source | Admitting: Cardiovascular Disease

## 2023-10-04 ENCOUNTER — Encounter (HOSPITAL_BASED_OUTPATIENT_CLINIC_OR_DEPARTMENT_OTHER): Payer: No Typology Code available for payment source | Admitting: Cardiovascular Disease

## 2023-10-04 ENCOUNTER — Encounter (HOSPITAL_BASED_OUTPATIENT_CLINIC_OR_DEPARTMENT_OTHER): Payer: Self-pay

## 2023-10-04 NOTE — Progress Notes (Deleted)
 Advanced Hypertension Clinic Follow-up:    Date:  10/04/2023   ID:  Mallory Fernandez, DOB 1975-05-22, MRN [id number]  PCP:  Pcp, No  Cardiologist:  Maudine Sos, MD  Nephrologist:  Referring MD: Dr. Lesta Rater  CC: Hypertension  History of Present Illness:    Mallory Fernandez is a 48 y.o. female with a hx of chronic pulmonary embolism, hypertension, fibromuscular dysplasia, and alcololism here for follow-up. She was initially seen in the hypertension clinic 07/13/2019.  She reported having hypertension for approximately 3 years.  She previously followed up with Dr. Nida Barrow and her BP was well-controlled on amlodipine , olmesartan  and HCTZ.  She switched care to Dr. Sherlene Diss.  In general her BP was well-controlled around 100-110 systolic.  On 2/22 she had an episode of syncope.  Her BP was 60/40.  She thought her machine was reading incorrectly.  When she stood up she passed out and hit the floor.  She had a significant laceration to her head and lip.  She went to urgent care and they referred her to [facility]. In the ED she had a chest CT that revealed an old PE that appeared chronic.  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  There was also an irregular narrowing with a beaded appearance in the right greater than left renal arteries concerning for fibromuscular dysplasia.  She noted that she was not eating or drinking very much at the time because she was very stressed.  She was told that she had to move out of her home within 30 days.  She also lost her dog and is stressed because her son recently moved out, so she is all alone.  She struggled with EtOH in the past.  She also quit smoking.  After leaving the hospital she was instructed to reduce her Tribenzor in half.  When she did this her blood pressure was still running a little low but better.  She saw Dr. Finas Huger office and was switched to losartan.  Her BP was then  running above goal.  She followed up with Dr. Lesta Rater on 3/1 and decided to stop taking all of her medications.  Her BP had been elevated. She was admitted 01/2019 with acute alcoholic pancreatitis.    She saw Sharren Decree, PA on 09/2020 and her BP was well controlled on HCTZ and olmesartan . She had a CTA of the abdomen and pelvis 09/2020 at [facility] that showed stable renal artery aneurysms. She had an episode of pancreatitis 04/2021 that was thought to be due to alcohol.   At her visit 08/2021, her blood pressure had been stable in February but was "horrible" since then in the setting of multiple life stressors and palpitations twice per day. She was struggling with anxiety and was interested in alternatives to BuSpar. She was admitted to the hospital 03/18/2022 for pancolitis. She had an abdominal/pelvic CT-A 08/2022 revealing similar findings of fibromuscular dysplasia with unchanged right renal artery aneurysm.  At her visit 09/2022 she was feeling well.  Blood pressure was generally well-controlled.  Previous antihypertensives: olmesartan  HCTZ Amlodipine  losartan  Past Medical History:  Diagnosis Date   Alcoholic pancreatitis 03/28/2017   Aortic atherosclerosis (HCC) 10/04/2022   Arterial fibromuscular dysplasia (HCC) 07/13/2019   Essential hypertension 07/13/2019   Hypertension    Palpitations 08/21/2021   Pulmonary embolism (HCC) 07/13/2019   Chronic PE noted on chest CT 06/2019   Pure hypercholesterolemia 10/04/2022  Tendonitis of shoulder 2009   bil shoulders     Past Surgical History:  Procedure Laterality Date   ABDOMINAL HYSTERECTOMY     BILATERAL SALPINGECTOMY Bilateral 04/23/2013   Procedure: BILATERAL SALPINGECTOMY;  Surgeon: Kandra Orn, MD;  Location: [facility];  Service: Gynecology;  Laterality: Bilateral;   TUBAL LIGATION      Current Medications: No outpatient medications have been marked as taking for the 10/04/23 encounter (Appointment) with Maudine Sos, MD.     Allergies:   Patient has no known allergies.   Social History   Socioeconomic History   Marital status: Single    Spouse name: Not on file   Number of children: Not on file   Years of education: Not on file   Highest education level: Not on file  Occupational History   Not on file  Tobacco Use   Smoking status: Former   Smokeless tobacco: Never  Substance and Sexual Activity   Alcohol use: Yes    Alcohol/week: 2.0 standard drinks of alcohol    Types: 2 Cans of beer per week   Drug use: No   Sexual activity: Not on file  Other Topics Concern   Not on file  Social History Narrative   Not on file   Social Drivers of Health   Financial Resource Strain: Low Risk  (06/16/2022)   Received from [facility], [facility]   Overall Financial Resource Strain (CARDIA)    Difficulty of Paying Living Expenses: Not hard at all  Food Insecurity: No Food Insecurity (06/16/2022)   Received from [facility], [facility]   Hunger Vital Sign    Worried About Running Out of Food in the Last Year: Never true    Ran Out of Food in the Last Year: Never true  Transportation Needs: No Transportation Needs (06/16/2022)   Received from [facility], [facility]   PRAPARE - Transportation    Lack of Transportation (Medical): No    Lack of Transportation (Non-Medical): No  Physical Activity: Insufficiently Active (06/16/2022)   Received from [facility], [facility]   Exercise Vital Sign    Days of Exercise per Week: 3 days    Minutes of Exercise per Session: 40 min  Stress: No Stress Concern Present (06/16/2022)   Received from [facility] - Occupational Stress Questionnaire    Feeling of Stress : Not at all  Recent Concern: Stress - Stress Concern Present (03/18/2022)   Received from [facility] - Occupational Stress Questionnaire    Feeling of Stress : To  some extent  Social Connections: Moderately Integrated (06/16/2022)   Received from [facility], [facility]   Social Network    How would you rate your social network (family, work, friends)?: Adequate participation with social networks     Family History: The patient's  family history includes Aortic aneurysm in her father; Breast cancer in her maternal grandmother; Diabetes in her maternal grandfather; Hypertension in her mother, sister, and sister; Uterine cancer in her paternal grandmother.  ROS:   Please see the history of present illness.    All other systems reviewed and are negative.  EKGs/Labs/Other Studies Reviewed:    CTA Abdomen/Pelvis  09/04/2022  ([facility]): FINDINGS:  NONVASCULAR  Lower chest: Within normal limits.  Abdomen and pelvis: Interval resolution of left ovarian cyst. Right ovarian follicle measuring 2.2 x 2.4 cm. 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 Otherwise, unremarkable. Hysterectomy.  MSK: No acute osseous abnormality. Moderate degenerative changes of the right hip.   VASCULAR  Aorta: Mild atherosclerotic plaque. No aneurysm.  Celiac axis: Within normal limits.  Superior mesenteric artery: Accessory right hepatic artery arising from the SMA, decrease in conspicuity compared to prior which may relate to phase of contrast bolus timing.  Renal arteries: Similar irregular contour of the right greater than left renal arteries with beading in keeping with fibromuscular dysplasia. Similar associated mild narrowing of the mid left renal artery and mild multifocal narrowing of the right renal artery. Narrow neck saccular aneurysm arising from the inferior aspect of the distal right renal artery measuring 0.8 x 0.8 x 0.5 cm, grossly similar to prior. Single arteries bilaterally.  Inferior mesenteric artery: Within normal limits.  Iliac arteries: Mild atherosclerotic plaque. No aneurysm.  Femoral arteries: Within normal limits.   Venous circulation: Pelvic phleboliths. Visualized veins are patent.   IMPRESSION:  1. Similar findings of fibromuscular dysplasia with unchanged right renal artery aneurysm.  2.  Ancillary findings as above.   Cardiac Event Monitor  05/2021: Scanned into Epic, personally reviewed. Predominantly normal sinus rhythm.  CTA Abdomen/Pelvis  09/20/2020 ([facility]): FINDINGS:   LOWER CHEST  Mediastinum/hila: Visualized portion within normal limits.  Heart/vessels: Within normal limits.  Pleura: Within normal limits.  Lungs: Within normal limits.   ABDOMEN  Liver: Within normal limits.  Gallbladder/biliary: Within normal limits.  Spleen: Within normal limits.  Pancreas: Within normal limits.  Adrenals: Within normal limits.  Kidneys: Symmetric renal enhancement without hydronephrosis. Lobulated contours bilaterally are most consistent with persistent fetal lobulations. 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  Peritoneum/mesenteries: Within normal limits.  Extraperitoneum: Within normal limits.  Gastrointestinal tract: No evidence of obstruction. Appendix within normal limits. Moderate colonic stool burden.   PELVIS  Ureters: Within normal limits.  Bladder: Within normal limits.  Reproductive system: Hysterectomy. An intermediate density structure within the right adnexa measuring 2.5 cm and approximately 35 Hounsfield units is most consistent with a hemorrhagic ovarian cyst and does not demonstrate enhancement between the noncontrast and contrast-enhanced sequences. An additional smaller simple appearing cyst is located more superiorly in the right adnexa.   VASCULAR  Aorta: Similar mild burden of calcified and noncalcified atherosclerosis without aneurysmal dilation or significant narrowing.  Celiac trunk and branches: Within normal limits.  Superior mesenteric trunk and branches: Accessory right hepatic artery arising  from the SMA.  Inferior mesenteric trunk and branches: Within normal limits.  Renal arteries: Similar irregular contour of the right greater than left renal arteries with "string of beads" appearance consistent with fibromuscular dysplasia. There is similar associated mild to moderate narrowing of the mid left renal artery and mild multifocal narrowing of the right renal artery. 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  Iliac arteries: Mild burden of atherosclerosis without aneurysmal dilation or significant narrowing.  Venous circulation: Within normal limits.   MSK: Age advanced right hip degenerative changes. No acute osseous abnormality.   CONCLUSION:   1.  Similar findings of fibromuscular dysplasia involving the renal arteries with similar size of the saccular renal artery aneurysm measuring 8 to 9 mm. No significant change from CTA dated 03/22/2020.  2.  Ancillary findings described in detail above.  CTA Chest/Aorta 08/13/2019: FINDINGS: CTA CHEST FINDINGS  Cardiovascular: Heart size normal. No pericardial effusion. Fair contrast opacification of pulmonary arterial tree; the exam was not optimized for detection of pulmonary emboli. Adequate contrast opacification of the thoracic aorta with no evidence of dissection, aneurysm, or stenosis. There is classic 3-vessel brachiocephalic arch anatomy without proximal stenosis. Minimal atheromatous plaque in the mid distal descending thoracic segment.   Mediastinum/Nodes: No hilar or mediastinal adenopathy.   Lungs/Pleura: No pleural effusion. No pneumothorax. Lungs clear.   Musculoskeletal: No chest wall abnormality. No acute or significant osseous findings.   Review of the MIP images confirms the above findings.   CTA ABDOMEN AND PELVIS FINDINGS  IMPRESSION: 1. Probable  fibromuscular dysplasia in bilateral renal arteries, with 0.8 cm aneurysm of branch vessel at the right renal hilum.   Aortic Atherosclerosis (ICD10-I70.0).   EKG:  EKG is personally reviewed. 10/04/2022:  EKG was not ordered. 08/21/2021: Sinus rhythm. Rate 60 bpm. Nonspecific T wave abnormalities. 07/13/2019: sinus rhythm.  Rate 60 bpm.    Recent Labs: No results found for requested labs within last 365 days.   Recent Lipid Panel    Component Value Date/Time   CHOL 225 (H) 08/21/2021 1625   TRIG 62 08/21/2021 1625   HDL 93 08/21/2021 1625   CHOLHDL 2.4 08/21/2021 1625   CHOLHDL 1.9 03/28/2017 0900   VLDL 13 03/28/2017 0900   LDLCALC 121 (H) 08/21/2021 1625    Physical Exam:    Wt Readings from Last 3 Encounters:  10/04/22 183 lb 1.6 oz (83.1 kg)  08/21/21 166 lb 12.8 oz (75.7 kg)  10/04/20 154 lb 12.8 oz (70.2 kg)    VS:  LMP 03/31/2013  , BMI There is no height or weight on file to calculate BMI. GENERAL:  Well appearing HEENT: Pupils equal round and reactive, fundi not visualized, oral mucosa unremarkable NECK:  No jugular venous distention, waveform within normal limits, carotid upstroke brisk and symmetric, no bruits LUNGS:  Clear to auscultation bilaterally HEART:  RRR.  PMI not displaced or sustained,S1 and S2 within normal limits, no S3, no S4, no clicks, no rubs, no murmurs ABD:  Flat, positive bowel sounds normal in frequency in pitch, no bruits, no rebound, no guarding, no midline pulsatile mass, no hepatomegaly, no splenomegaly EXT:  2 plus pulses throughout, no edema, no cyanosis no clubbing SKIN:  No rashes no nodules NEURO:  Cranial nerves II through XII grossly intact, motor grossly intact throughout PSYCH:  Cognitively intact, oriented to person place and time   ASSESSMENT:    1. Essential hypertension   2. Single subsegmental pulmonary embolism without acute cor pulmonale (HCC)   3. Arterial fibromuscular dysplasia (HCC)   4. Aortic atherosclerosis  (HCC)     PLAN:    Assessment and Plan Assessment & Plan     Disposition:    FU with Augustine Leverette C. Theodis Fiscal, MD, [facility] in 1 year.  Medication Adjustments/Labs and Tests Ordered: Current medicines are reviewed at length with the patient today.  Concerns regarding medicines are outlined above.   No orders of the defined types were placed in this encounter.  No orders of the defined types were placed in this encounter.   Signed, Maudine Sos, MD  10/04/2023 12:59 PM    [facility]

## 2023-11-25 ENCOUNTER — Other Ambulatory Visit (HOSPITAL_BASED_OUTPATIENT_CLINIC_OR_DEPARTMENT_OTHER): Payer: Self-pay | Admitting: Cardiovascular Disease

## 2023-12-20 ENCOUNTER — Other Ambulatory Visit: Payer: Self-pay | Admitting: Cardiovascular Disease

## 2023-12-20 MED ORDER — OLMESARTAN MEDOXOMIL 20 MG PO TABS: 20.0000 mg | ORAL_TABLET | Freq: Every day | ORAL | 0 refills | Status: AC

## 2023-12-20 MED ORDER — HYDROCHLOROTHIAZIDE 12.5 MG PO TABS: 12.5000 mg | ORAL_TABLET | Freq: Every day | ORAL | 0 refills | Status: AC

## 2023-12-22 ENCOUNTER — Other Ambulatory Visit (HOSPITAL_BASED_OUTPATIENT_CLINIC_OR_DEPARTMENT_OTHER): Payer: Self-pay | Admitting: Cardiovascular Disease
